# Patient Record
Sex: Female | Born: 1965
Health system: Southern US, Community
[De-identification: ages and names within clinical notes are randomized; demographics above are authoritative.]

## PROBLEM LIST (undated history)

## (undated) DIAGNOSIS — I10 Essential (primary) hypertension: Secondary | ICD-10-CM

## (undated) DIAGNOSIS — M1990 Unspecified osteoarthritis, unspecified site: Secondary | ICD-10-CM

## (undated) DIAGNOSIS — T7840XA Allergy, unspecified, initial encounter: Secondary | ICD-10-CM

## (undated) DIAGNOSIS — Z91018 Allergy to other foods: Secondary | ICD-10-CM

## (undated) DIAGNOSIS — E7800 Pure hypercholesterolemia, unspecified: Secondary | ICD-10-CM

## (undated) DIAGNOSIS — E782 Mixed hyperlipidemia: Secondary | ICD-10-CM

## (undated) HISTORY — PX: EYE SURGERY: SHX253

## (undated) HISTORY — DX: Essential (primary) hypertension: I10

## (undated) HISTORY — DX: Allergy, unspecified, initial encounter: T78.40XA

## (undated) HISTORY — DX: Allergy to other foods: Z91.018

## (undated) HISTORY — PX: FRACTURE SURGERY: SHX138

## (undated) HISTORY — DX: Unspecified osteoarthritis, unspecified site: M19.90

## (undated) HISTORY — DX: Mixed hyperlipidemia: E78.2

## (undated) HISTORY — PX: TUBAL LIGATION: SHX77

---

## 1997-11-11 ENCOUNTER — Other Ambulatory Visit: Admission: RE | Admit: 1997-11-11 | Discharge: 1997-11-11 | Payer: Self-pay | Admitting: Obstetrics and Gynecology

## 1997-11-12 ENCOUNTER — Other Ambulatory Visit: Admission: RE | Admit: 1997-11-12 | Discharge: 1997-11-12 | Payer: Self-pay | Admitting: Obstetrics and Gynecology

## 1997-12-10 ENCOUNTER — Other Ambulatory Visit: Admission: RE | Admit: 1997-12-10 | Discharge: 1997-12-10 | Payer: Self-pay | Admitting: Obstetrics & Gynecology

## 1998-02-02 ENCOUNTER — Other Ambulatory Visit: Admission: RE | Admit: 1998-02-02 | Discharge: 1998-02-02 | Payer: Self-pay | Admitting: Obstetrics and Gynecology

## 1998-05-02 ENCOUNTER — Inpatient Hospital Stay (HOSPITAL_COMMUNITY): Admission: AD | Admit: 1998-05-02 | Discharge: 1998-05-04 | Payer: Self-pay | Admitting: Obstetrics and Gynecology

## 2001-12-19 ENCOUNTER — Encounter: Payer: Self-pay | Admitting: Physical Medicine & Rehabilitation

## 2001-12-19 ENCOUNTER — Ambulatory Visit (HOSPITAL_COMMUNITY)
Admission: RE | Admit: 2001-12-19 | Discharge: 2001-12-19 | Payer: Self-pay | Admitting: Physical Medicine & Rehabilitation

## 2003-05-05 ENCOUNTER — Other Ambulatory Visit: Admission: RE | Admit: 2003-05-05 | Discharge: 2003-05-05 | Payer: Self-pay | Admitting: Obstetrics and Gynecology

## 2009-04-22 ENCOUNTER — Ambulatory Visit (HOSPITAL_COMMUNITY): Admission: RE | Admit: 2009-04-22 | Discharge: 2009-04-22 | Payer: Self-pay | Admitting: Obstetrics

## 2010-11-16 ENCOUNTER — Emergency Department (HOSPITAL_COMMUNITY)
Admission: EM | Admit: 2010-11-16 | Discharge: 2010-11-16 | Disposition: A | Discharge status: Home / Self Care | Payer: 59 | Attending: Emergency Medicine | Admitting: Emergency Medicine

## 2010-11-16 DIAGNOSIS — Z23 Encounter for immunization: Secondary | ICD-10-CM | POA: Insufficient documentation

## 2010-11-16 DIAGNOSIS — Z203 Contact with and (suspected) exposure to rabies: Secondary | ICD-10-CM | POA: Insufficient documentation

## 2010-11-19 ENCOUNTER — Inpatient Hospital Stay (INDEPENDENT_AMBULATORY_CARE_PROVIDER_SITE_OTHER)
Admission: RE | Admit: 2010-11-19 | Discharge: 2010-11-19 | Disposition: A | Discharge status: Home / Self Care | Payer: 59 | Source: Ambulatory Visit

## 2010-11-19 DIAGNOSIS — Z23 Encounter for immunization: Secondary | ICD-10-CM

## 2010-11-23 ENCOUNTER — Inpatient Hospital Stay (INDEPENDENT_AMBULATORY_CARE_PROVIDER_SITE_OTHER)
Admission: RE | Admit: 2010-11-23 | Discharge: 2010-11-23 | Disposition: A | Discharge status: Home / Self Care | Payer: 59 | Source: Ambulatory Visit | Attending: Family Medicine | Admitting: Family Medicine

## 2010-11-23 DIAGNOSIS — Z23 Encounter for immunization: Secondary | ICD-10-CM

## 2010-12-01 ENCOUNTER — Inpatient Hospital Stay (INDEPENDENT_AMBULATORY_CARE_PROVIDER_SITE_OTHER)
Admission: RE | Admit: 2010-12-01 | Discharge: 2010-12-01 | Disposition: A | Discharge status: Home / Self Care | Payer: 59 | Source: Ambulatory Visit | Attending: Emergency Medicine | Admitting: Emergency Medicine

## 2010-12-01 DIAGNOSIS — Z23 Encounter for immunization: Secondary | ICD-10-CM

## 2012-05-22 ENCOUNTER — Telehealth: Payer: Self-pay

## 2012-05-22 NOTE — Telephone Encounter (Signed)
Pt is requesting copy of TB form to be sent to FAX  attn: brandy [phone]  [phone]  NEVER MIND _ last one is out of date.   Patient will come in to have another TB test done.

## 2013-07-26 ENCOUNTER — Emergency Department (HOSPITAL_COMMUNITY)
Admission: EM | Admit: 2013-07-26 | Discharge: 2013-07-26 | Disposition: A | Discharge status: Home / Self Care | Payer: 59 | Source: Home / Self Care | Attending: Family Medicine | Admitting: Family Medicine

## 2013-07-26 DIAGNOSIS — K0889 Other specified disorders of teeth and supporting structures: Secondary | ICD-10-CM

## 2013-07-26 DIAGNOSIS — K089 Disorder of teeth and supporting structures, unspecified: Secondary | ICD-10-CM

## 2013-07-26 MED ORDER — TRAMADOL HCL 50 MG PO TABS: 50.0000 mg | ORAL_TABLET | Freq: Four times a day (QID) | ORAL | Status: DC | PRN

## 2013-07-26 MED ORDER — AMOXICILLIN 875 MG PO TABS: 875.0000 mg | ORAL_TABLET | Freq: Two times a day (BID) | ORAL | Status: DC

## 2013-07-26 NOTE — ED Provider Notes (Signed)
CSN: [id number]     Arrival date & time 07/26/13  1355 History   First MD Initiated Contact with Patient 07/26/13 1524     Chief Complaint  Patient presents with  . Dental Pain   (Consider location/radiation/quality/duration/timing/severity/associated sxs/prior Treatment) HPI Comments: 48f with dental pain.  This began last night. She has worsening pain in her anterior left upper jaw that is throbbing in nature. She tried to look in there but does not see anything abnormal. She denies any trauma. She has a history of a similar condition and a different tooth couple of years ago. No other symptoms  Patient is a 48 y.o. female presenting with tooth pain.  Dental Pain Associated symptoms: no fever     No past medical history on file. No past surgical history on file. No family history on file. History  Substance Use Topics  . Smoking status: Not on file  . Smokeless tobacco: Not on file  . Alcohol Use: Not on file   OB History   No data available     Review of Systems  Constitutional: Negative for fever and chills.  HENT: Positive for dental problem.   Eyes: Negative for visual disturbance.  Respiratory: Negative for cough and shortness of breath.   Cardiovascular: Negative for chest pain, palpitations and leg swelling.  Gastrointestinal: Negative for nausea, vomiting and abdominal pain.  Endocrine: Negative for polydipsia and polyuria.  Genitourinary: Negative for dysuria, urgency and frequency.  Musculoskeletal: Negative for arthralgias and myalgias.  Skin: Negative for rash.  Neurological: Negative for dizziness, weakness and light-headedness.    Allergies  Codeine sulfate  Home Medications   Current Outpatient Rx  Name  Route  Sig  Dispense  Refill  . naproxen sodium (ALEVE) 220 MG tablet   Oral   Take 220 mg by mouth as needed.         Marland Kitchen amoxicillin (AMOXIL) 875 MG tablet   Oral   Take 1 tablet (875 mg total) by mouth 2 (two) times daily.   28 tablet   0   . traMADol (ULTRAM) 50 MG tablet   Oral   Take 1 tablet (50 mg total) by mouth every 6 (six) hours as needed.   15 tablet   0    BP 144/96  Pulse 92  Temp(Src) 98.4 F (36.9 C) (Oral)  Resp 16  SpO2 97%  LMP 06/18/2013 Physical Exam  Nursing note and vitals reviewed. Constitutional: She is oriented to person, place, and time. Vital signs are normal. She appears well-developed and well-nourished. No distress.  HENT:  Head: Normocephalic and atraumatic.  Mouth/Throat: Uvula is midline, oropharynx is clear and moist and mucous membranes are normal. She does not have dentures. No oral lesions. No trismus in the jaw. Normal dentition. No dental abscesses, uvula swelling, lacerations or dental caries.  Cardiovascular: Normal rate, regular rhythm and normal heart sounds.  Exam reveals no gallop and no friction rub.   No murmur heard. Pulmonary/Chest: Effort normal. No respiratory distress.  Neurological: She is alert and oriented to person, place, and time. She has normal strength. Coordination normal.  Skin: Skin is warm and dry. No rash noted. She is not diaphoretic.  Psychiatric: She has a normal mood and affect. Judgment normal.    ED Course  Procedures (including critical care time) Labs Review Labs Reviewed - No data to display Imaging Review No results found.  EKG normal  MDM   1. Toothache    No obvious source of infection.  We'll give amoxicillin and tramadol for possible early dental and she will followup with her dentist ASAP   Meds ordered this encounter  Medications  . naproxen sodium (ALEVE) 220 MG tablet    Sig: Take 220 mg by mouth as needed.  Marland Kitchen amoxicillin (AMOXIL) 875 MG tablet    Sig: Take 1 tablet (875 mg total) by mouth 2 (two) times daily.    Dispense:  28 tablet    Refill:  0    Order Specific Question:  Supervising Provider    Answer:  Billy Fischer [phone]  . traMADol (ULTRAM) 50 MG tablet    Sig: Take 1 tablet (50 mg total) by mouth every 6  (six) hours as needed.    Dispense:  15 tablet    Refill:  0    Order Specific Question:  Supervising Provider    Answer:  Ihor Gully D [place], PA-C 07/26/13 [phone]

## 2013-07-26 NOTE — Discharge Instructions (Signed)

## 2013-07-26 NOTE — ED Notes (Signed)
Complains of tooth pain on left side, states has been taking OTC Aleve 200 mg, last dose 11:39 today

## 2013-07-28 ENCOUNTER — Other Ambulatory Visit (HOSPITAL_COMMUNITY): Payer: Self-pay | Admitting: Obstetrics

## 2013-07-28 DIAGNOSIS — Z1231 Encounter for screening mammogram for malignant neoplasm of breast: Secondary | ICD-10-CM

## 2013-07-28 NOTE — ED Provider Notes (Signed)
Medical screening examination/treatment/procedure(s) were performed by a resident physician or non-physician practitioner and as the supervising physician I was immediately available for consultation/collaboration.  Sherl Yzaguirre, MD    Mayeli Bornhorst S Xochil Shanker, MD 07/28/13 0749 

## 2013-07-30 ENCOUNTER — Ambulatory Visit (HOSPITAL_COMMUNITY): Payer: 59

## 2013-07-31 ENCOUNTER — Ambulatory Visit (HOSPITAL_COMMUNITY)
Admission: RE | Admit: 2013-07-31 | Discharge: 2013-07-31 | Disposition: A | Discharge status: Home / Self Care | Payer: 59 | Source: Ambulatory Visit | Attending: Obstetrics | Admitting: Obstetrics

## 2013-07-31 ENCOUNTER — Ambulatory Visit (HOSPITAL_COMMUNITY): Payer: 59

## 2013-07-31 DIAGNOSIS — Z1231 Encounter for screening mammogram for malignant neoplasm of breast: Secondary | ICD-10-CM | POA: Insufficient documentation

## 2013-10-27 ENCOUNTER — Other Ambulatory Visit: Payer: Self-pay | Admitting: Obstetrics and Gynecology

## 2013-10-27 DIAGNOSIS — R109 Unspecified abdominal pain: Secondary | ICD-10-CM

## 2013-11-05 ENCOUNTER — Ambulatory Visit (HOSPITAL_COMMUNITY): Payer: 59

## 2014-04-22 ENCOUNTER — Encounter (HOSPITAL_COMMUNITY): Payer: Self-pay | Admitting: Emergency Medicine

## 2014-04-22 ENCOUNTER — Emergency Department (HOSPITAL_COMMUNITY)
Admission: EM | Admit: 2014-04-22 | Discharge: 2014-04-22 | Disposition: A | Discharge status: Home / Self Care | Payer: 59 | Source: Home / Self Care | Attending: Family Medicine | Admitting: Family Medicine

## 2014-04-22 ENCOUNTER — Other Ambulatory Visit: Payer: Self-pay

## 2014-04-22 DIAGNOSIS — G5622 Lesion of ulnar nerve, left upper limb: Secondary | ICD-10-CM

## 2014-04-22 NOTE — Discharge Instructions (Signed)
Thank you for coming in today. Followup with Dr. Ernie Hew or another primary care provider. Followup with Dr. Alfonso Ramus if the arm continues to bother you.  Use a towel technique like we talked about.  Call or go to the emergency room if you get worse, have trouble breathing, have chest pains, or palpitations.    Cubital Tunnel Syndrome (Ulnar Neuritis) Cubital tunnel syndrome is a disorder of the nervous system of the elbow and upper arm the causes pain, tingling, weakness of the hand, or the loss of feeling in the ring and little fingers. The disorder is caused by a compression or stretching of the ulnar nerve at the elbow and in the forearm by muscles or ligament-like tissues. The ulnar nerve is susceptible to injury because it has little tissue that protects it at the elbow. The lack of protection increases the risk of injury. Cubital tunnel syndrome may decrease athletic performance in sports that require strong hand or wrist actions (tennis or racquetball).  SYMPTOMS   Clumsiness or weakness of the hand.  Poor dexterity (fine hand function).  Tenderness of the inner elbow.  Aching or soreness of the inner elbow.  Increased pain with forced full-elbow bending.  Reduced control with throwing, such as pitching.  Tingling, numbness, or burning inside the forearm or in part of the hand or fingers (especially the little finger or ring finger).  Sharp pains that shoot from the elbow down to the wrist and hand.  A weak grip, especially power grip, and a weak pinch.  Reduced performance in sports that require a strong grip. CAUSES   Increased pressure on the ulnar nerve at the elbow, arm, or forearm caused by swollen, inflamed, or scarred tissues; ligament-like; or between muscles.  Stretching of the nerve due to loose elbow ligaments.  Trauma to the nerve at the elbow.  Repetitive elbow bending. RISK INCREASES WITH:  Poor strength or flexibility.  Inadequate warm-up properly  physical activity.  Diabetes mellitus.  under-active thyroid gland(hypothyroidism).  Repetitive and/or strenuous throwing motions such as baseball and javelin throwing.  Contact sports (football, soccer, rugby, or lacrosse).  Other elbow conditions (medial epicondylitis or loose inner elbow ligaments). PREVENTION  Warm up and stretch properly before activity.  Maintain physical fitness:  Wrist, forearm, and elbow flexibility.  Muscle strength and endurance.  Cardiovascular fitness.  Wear proper protective equipment, including elbow pads.  Learn and use proper throwing techniques. PROGNOSIS  Cubital tunnel syndrome is typically curable is treated appropriately. Is some cases the condition may heal without treatment. If the muscle begins to waste or the nerve damage worsens, surgery may be necessary. RELATED COMPLICATIONS   Permanent numbness and weakness of the ring and little fingers.  Weak grip.  Permanent paralysis of some hand and finger muscles.  Risks associated with surgery, including infection, bleeding, injury to nerves (including the ulnar nerve), recurrent or continued symptoms, and elbow stiffness. TREATMENT  Treatment initially involves stopping the activities that cause the symptoms to worsen. Medications and ice can be used to reduce pain in inflammation. Splinting and protecting the elbow with padding (especially at night) to prevent full bending of the elbow may help. Stretching and strengthening exercises of the muscles of the forearm and elbow are important, and they may be performed at home or with the assistance of a therapist. If conservative treatment is not successful, surgery may be necessary to reduce compression of the nerve. Before return to sport, assessment for proper throwing and hitting mechanics is important.  MEDICATION   If pain medication is necessary, nonsteroidal anti-inflammatory medications, such as aspirin and ibuprofen, or other minor  pain relievers, such as acetaminophen, are often recommended. Contact your caregiver immediately if any bleeding, stomach upset, or signs of an allergic reaction occur.  Prescription pain relievers are usually only prescribed after surgery. Use only as directed and only as much as you need. COLD THERAPY   Cold treatment (icing) relieves pain and reduces inflammation. Cold treatment should be applied for 10 to 15 minutes every 2 to 3 hours for inflammation and pain and immediately after any activity that aggravates your symptoms. Use ice packs or an ice massage. SEEK MEDICAL CARE IF:   Symptoms get worse or do not improve in 2 weeks despite treatment.  You experience pain, numbness, or coldness in the hand.  Blue, gray, or dark color appears in the fingernails.  Any of the following occur after surgery: increased pain, swelling, redness, drainage, or bleeding in the surgical area or signs of infection.  New, unexplained symptoms develop (drugs used in treatment may produce side effects). Document Released: 07/03/2005 Document Revised: 09/25/2011 Document Reviewed: 10/15/2008 [facility] Patient Information 2015 [place], [place]. This information is not intended to replace advice given to you by your health care provider. Make sure you discuss any questions you have with your health care provider.  PRIMARY CARE Paramedic at [place], [place] Ph [phone]  Fax [phone]  Therapist, music at [facility] [address] [place], [place] Ph [phone]  Fax [phone]  Therapist, music at [place] / [place] [phone] [address] [place], [place] Ph [phone]  Fax [phone]  [facility] at [facility] [address] [place]  [place], [place] Ph [phone]  Fax [phone]  [place] [address] [place] [address] [place], [place] Ph [phone]  Fax [phone]  [facility] at [facility] [place], [place] Ph [phone]  Fax [phone]   [place] @ [place] [address] Phone: [phone]   [place] @ [facility] [place]. [place] [address] Phone: [place] @ [place] [address] [place] [address] Phone: [place] @ [place]. [place] [address] Phone: [place] @ [place]. [employer], [place] [address] Phone: [phone]   [place] @ [place] [address] [place] [address] Phone: [phone]   Dr. Rachell Cipro [address] [place] [address] [phone]

## 2014-04-22 NOTE — ED Notes (Signed)
C/o intermittent left arm numbness onset 1 yr ++  Also c/o intermittent  "warm sensation" that begins at lower abd and travels to shoulders  Alert, no signs of acute distress.

## 2014-04-22 NOTE — ED Provider Notes (Signed)
Beverly Hughes is a 48 y.o. female who presents to Urgent Care today for left arm numbness. Patient has a one-year history of intermittent left arm numbness. She especially notes the numbness in the ulnar aspect of her hand. This morning she awoke with numbness and tingling. She denies any chest pains palpitations or shortness of breath. She is worried about her heart and would like an EKG if possible. She denies any injury. She does not have her primary care Dr.   History reviewed. No pertinent past medical history. History  Substance Use Topics  . Smoking status: Never Smoker   . Smokeless tobacco: Not on file  . Alcohol Use: No   ROS as above Medications: No current facility-administered medications for this encounter.   Current Outpatient Prescriptions  Medication Sig Dispense Refill  . amoxicillin (AMOXIL) 875 MG tablet Take 1 tablet (875 mg total) by mouth 2 (two) times daily.  28 tablet  0  . naproxen sodium (ALEVE) 220 MG tablet Take 220 mg by mouth as needed.      . traMADol (ULTRAM) 50 MG tablet Take 1 tablet (50 mg total) by mouth every 6 (six) hours as needed.  15 tablet  0    Exam:  BP 124/78  Pulse 82  Temp(Src) 99 F (37.2 C) (Oral)  Resp 16  SpO2 97%  LMP 02/28/2014 Gen: Well NAD HEENT: EOMI,  MMM Lungs: Normal work of breathing. CTABL Heart: RRR no MRG Abd: NABS, Soft. Nondistended, Nontender Exts: Brisk capillary refill, warm and well perfused.  Left arm: Normal shoulder elbow and wrist range of motion. Positive Tinel's test at the cubital tunnel. Symptoms reproducible with elbow flexion. Pulses capillary refill sensation and strength are intact distally.  Twelve-lead EKG shows normal sinus rhythm at 72 beats per minute with no ST segment elevation or depression.  No results found for this or any previous visit (from the past 24 hour(s)). No results found.  Assessment and Plan: 48 y.o. female with left arm cubital tunnel syndrome.  Plan for towel  splint at night. Followup with PCP. Follow up with sports medicine if not improving from a cubital tunnel syndrome standpoint.  Discussed warning signs or symptoms. Please see discharge instructions. Patient expresses understanding.     Gregor Hams, MD 04/22/14 [phone]

## 2014-07-03 ENCOUNTER — Other Ambulatory Visit (HOSPITAL_COMMUNITY): Payer: Self-pay | Admitting: Obstetrics and Gynecology

## 2014-07-03 DIAGNOSIS — Z1231 Encounter for screening mammogram for malignant neoplasm of breast: Secondary | ICD-10-CM

## 2014-08-04 ENCOUNTER — Ambulatory Visit (HOSPITAL_COMMUNITY): Payer: 59 | Attending: Obstetrics and Gynecology

## 2016-10-29 ENCOUNTER — Encounter (HOSPITAL_COMMUNITY): Payer: Self-pay | Admitting: Emergency Medicine

## 2016-10-29 ENCOUNTER — Ambulatory Visit (HOSPITAL_COMMUNITY)
Admission: EM | Admit: 2016-10-29 | Discharge: 2016-10-29 | Disposition: A | Discharge status: Home / Self Care | Payer: 59 | Attending: Family Medicine | Admitting: Family Medicine

## 2016-10-29 DIAGNOSIS — N390 Urinary tract infection, site not specified: Secondary | ICD-10-CM | POA: Diagnosis not present

## 2016-10-29 DIAGNOSIS — R350 Frequency of micturition: Secondary | ICD-10-CM | POA: Diagnosis not present

## 2016-10-29 DIAGNOSIS — R319 Hematuria, unspecified: Secondary | ICD-10-CM

## 2016-10-29 DIAGNOSIS — Z3202 Encounter for pregnancy test, result negative: Secondary | ICD-10-CM

## 2016-10-29 HISTORY — DX: Pure hypercholesterolemia, unspecified: E78.00

## 2016-10-29 LAB — POCT PREGNANCY, URINE: Preg Test, Ur: NEGATIVE

## 2016-10-29 LAB — POCT URINALYSIS DIP (DEVICE)
Bilirubin Urine: NEGATIVE
Glucose, UA: NEGATIVE mg/dL
Ketones, ur: NEGATIVE mg/dL
Nitrite: POSITIVE — AB
Protein, ur: NEGATIVE mg/dL
Specific Gravity, Urine: 1.025 (ref 1.005–1.030)
Urobilinogen, UA: 0.2 mg/dL (ref 0.0–1.0)
pH: 5.5 (ref 5.0–8.0)

## 2016-10-29 MED ORDER — SULFAMETHOXAZOLE-TRIMETHOPRIM 800-160 MG PO TABS: 1.0000 | ORAL_TABLET | Freq: Two times a day (BID) | ORAL | 0 refills | Status: AC

## 2016-10-29 NOTE — ED Triage Notes (Signed)
Onset Friday of lower pelvic pain "like a uti".  Noticed some blood Friday.  Blood was darker on Saturday.  Feels pressure and pain with urination

## 2016-10-29 NOTE — ED Provider Notes (Signed)
[place]    CSN: [id number] Arrival date & time: 10/29/16  1238     History   Chief Complaint Chief Complaint  Patient presents with  . Abdominal Pain    HPI Beverly Hughes is a 50 y.o. female.   Onset Friday of lower pelvic pain "like a uti".  Noticed some blood in the toilet bowl Friday.  Blood was darker on Saturday.  Feels pressure and pain with urination  Last menstrual period for 3 was 3-1/2 weeks ago.  Patient works as a Marine scientist at [employer].      Past Medical History:  Diagnosis Date  . High cholesterol     There are no active problems to display for this patient.   History reviewed. No pertinent surgical history.  OB History    No data available       Home Medications    Prior to Admission medications   Medication Sig Start Date End Date Taking? Authorizing Provider  ibuprofen (ADVIL,MOTRIN) 200 MG tablet Take 200 mg by mouth every 6 (six) hours as needed.   Yes Historical Provider, MD  sulfamethoxazole-trimethoprim (BACTRIM DS,SEPTRA DS) 800-160 MG tablet Take 1 tablet by mouth 2 (two) times daily. 10/29/16 11/05/16  Robyn Haber, MD    Family History No family history on file.  Social History Social History  Substance Use Topics  . Smoking status: Never Smoker  . Smokeless tobacco: Not on file  . Alcohol use No     Allergies   Codeine sulfate   Review of Systems Review of Systems  Constitutional: Negative.   HENT: Negative.   Gastrointestinal: Negative.   Genitourinary: Positive for frequency, hematuria and pelvic pain.  All other systems reviewed and are negative.    Physical Exam Triage Vital Signs ED Triage Vitals  Enc Vitals Group     BP      Pulse      Resp      Temp      Temp src      SpO2      Weight      Height      Head Circumference      Peak Flow      Pain Score      Pain Loc      Pain Edu?      Excl. in [place]?    No data found.   Updated Vital Signs BP (!) 144/82 (BP  Location: Left Arm)   Pulse 92   Temp 98.6 F (37 C) (Oral)   Resp 16   LMP 10/03/2016   SpO2 98%    Physical Exam  Constitutional: She is oriented to person, place, and time. She appears well-developed and well-nourished.  HENT:  Right Ear: External ear normal.  Left Ear: External ear normal.  Mouth/Throat: Oropharynx is clear and moist.  Eyes: Conjunctivae and EOM are normal. Pupils are equal, round, and reactive to light.  Neck: Normal range of motion. Neck supple.  Pulmonary/Chest: Effort normal.  Abdominal: Soft. There is no tenderness.  Musculoskeletal: Normal range of motion.  Neurological: She is alert and oriented to person, place, and time.  Skin: Skin is warm and dry.  Nursing note and vitals reviewed.    UC Treatments / Results  Labs (all labs ordered are listed, but only abnormal results are displayed) Labs Reviewed  POCT URINALYSIS DIP (DEVICE) - Abnormal; Notable for the following:       Result Value   Hgb urine  dipstick SMALL (*)    Nitrite POSITIVE (*)    Leukocytes, UA TRACE (*)    All other components within normal limits  POCT PREGNANCY, URINE    EKG  EKG Interpretation None       Radiology No results found.  Procedures Procedures (including critical care time)  Medications Ordered in UC Medications - No data to display   Initial Impression / Assessment and Plan / UC Course  I have reviewed the triage vital signs and the nursing notes.  Pertinent labs & imaging results that were available during my care of the patient were reviewed by me and considered in my medical decision making (see chart for details).     Final Clinical Impressions(s) / UC Diagnoses   Final diagnoses:  Lower urinary tract infectious disease    New Prescriptions New Prescriptions   SULFAMETHOXAZOLE-TRIMETHOPRIM (BACTRIM DS,SEPTRA DS) 800-160 MG TABLET    Take 1 tablet by mouth 2 (two) times daily.     Robyn Haber, MD 10/29/16 1328

## 2016-10-29 NOTE — Discharge Instructions (Signed)
You are having a urinary tract infection.  Take all of the antibiotic along with plenty of fluids.

## 2017-12-13 DIAGNOSIS — Z01419 Encounter for gynecological examination (general) (routine) without abnormal findings: Secondary | ICD-10-CM | POA: Diagnosis not present

## 2017-12-13 DIAGNOSIS — Z6839 Body mass index (BMI) 39.0-39.9, adult: Secondary | ICD-10-CM | POA: Diagnosis not present

## 2017-12-13 DIAGNOSIS — R109 Unspecified abdominal pain: Secondary | ICD-10-CM | POA: Diagnosis not present

## 2018-07-28 ENCOUNTER — Encounter (HOSPITAL_COMMUNITY): Payer: Self-pay | Admitting: Emergency Medicine

## 2018-07-28 ENCOUNTER — Emergency Department (HOSPITAL_COMMUNITY)
Admission: EM | Admit: 2018-07-28 | Discharge: 2018-07-28 | Disposition: A | Discharge status: Home / Self Care | Payer: 59 | Attending: Emergency Medicine | Admitting: Emergency Medicine

## 2018-07-28 ENCOUNTER — Other Ambulatory Visit: Payer: Self-pay

## 2018-07-28 DIAGNOSIS — M542 Cervicalgia: Secondary | ICD-10-CM | POA: Diagnosis not present

## 2018-07-28 DIAGNOSIS — N644 Mastodynia: Secondary | ICD-10-CM

## 2018-07-28 DIAGNOSIS — N6320 Unspecified lump in the left breast, unspecified quadrant: Secondary | ICD-10-CM | POA: Insufficient documentation

## 2018-07-28 NOTE — ED Triage Notes (Signed)
Pt complains of lump on left breast for 5 days Pt complains of intermittent  left leg swelling and pain for 2 years. pt also complains of neck pain for 1 week. Pt states she has degenerative disc disease. Pt wants MRI, Pt wants head to toe exam.

## 2018-07-28 NOTE — ED Notes (Signed)
Patient verbalizes understanding of discharge instructions. Opportunity for questioning and answers were provided. Armband removed by staff, pt discharged from ED.  

## 2018-07-28 NOTE — ED Notes (Signed)
Pt also complains of burning sensation left rib pain for 2 years

## 2018-07-28 NOTE — ED Provider Notes (Signed)
[facility] EMERGENCY DEPARTMENT Provider Note   CSN: [id number] Arrival date & time: 07/28/18  1604     History   Chief Complaint Chief Complaint  Patient presents with  . chest pain/lump on left side of chest  . Neck Pain  . left leg swelling    HPI Beverly Hughes is a 53 y.o. female.  53 year old female with past medical history including hyperlipidemia who presents with multiple complaints including left breast pain and chronic neck pain.  Patient states that for the past 5 days, she has noticed an area of her left upper breast that is sore and she thinks that she feels a lump.  She is about to start her menstrual cycle any day now and at first she thought it was related to this but she then became worried that it.  No redness or nipple discharge.  No fevers recent illness.  She also notes that she has a history of degenerative disc disease and has been having worsening problems with her neck pain.  No extremity numbness or weakness.  She requests a "head to toe exam."  She does not follow with PCP. Denies any neck trauma or injury.   The history is provided by the patient.  Neck Pain    Past Medical History:  Diagnosis Date  . High cholesterol     There are no active problems to display for this patient.   History reviewed. No pertinent surgical history.   OB History   No obstetric history on file.      Home Medications    Prior to Admission medications   Medication Sig Start Date End Date Taking? Authorizing Provider  ibuprofen (ADVIL,MOTRIN) 200 MG tablet Take 200 mg by mouth every 6 (six) hours as needed.    [provider]    Family History No family history on file.  Social History Social History   Tobacco Use  . Smoking status: Never Smoker  Substance Use Topics  . Alcohol use: No  . Drug use: No     Allergies   Codeine sulfate   Review of Systems Review of Systems  Musculoskeletal: Positive for neck pain.     All other systems reviewed and are negative except that which was mentioned in HPI   Physical Exam Updated Vital Signs BP (!) 165/97 (BP Location: Right Arm)   Pulse 97   Temp 98.4 F (36.9 C) (Oral)   Resp 16   SpO2 100%   Physical Exam Vitals signs and nursing note reviewed. Exam conducted with a chaperone present.  Constitutional:      General: She is not in acute distress.    Appearance: She is well-developed.  HENT:     Head: Normocephalic and atraumatic.  Eyes:     Conjunctiva/sclera: Conjunctivae normal.     Pupils: Pupils are equal, round, and reactive to light.  Neck:     Musculoskeletal: Neck supple.  Cardiovascular:     Rate and Rhythm: Normal rate and regular rhythm.     Heart sounds: Normal heart sounds. No murmur.  Pulmonary:     Effort: Pulmonary effort is normal.     Breath sounds: Normal breath sounds.  Abdominal:     General: Bowel sounds are normal. There is no distension.     Palpations: Abdomen is soft.     Tenderness: There is no abdominal tenderness.  Skin:    General: Skin is warm and dry.     Comments: No erythema,  induration, warmth, or masses felt on left upper central breast  Neurological:     Mental Status: She is alert and oriented to person, place, and time.     Sensory: No sensory deficit.     Motor: No weakness.     Comments: Fluent speech 5/5 strength and normal sensation throughout  Psychiatric:        Judgment: Judgment normal.      ED Treatments / Results  Labs (all labs ordered are listed, but only abnormal results are displayed) Labs Reviewed - No data to display  EKG None  Radiology No results found.  Procedures Procedures (including critical care time)  Medications Ordered in ED Medications - No data to display   Initial Impression / Assessment and Plan / ED Course  I have reviewed the triage vital signs and the nursing notes.       No signs of breast abscess or infectious process on exam and I  could not actually palpate any mass.  Advised patient to follow-up with her OB/GYN to have outpatient breast ultrasound.  Regarding her neck pain, she has no red flag features and is neurologically intact.  I have advised that she follow-up with a PCP for further assessment and consideration of physical therapy.  Discussed supportive measures regarding her pain.  Return precautions reviewed.  Final Clinical Impressions(s) / ED Diagnoses   Final diagnoses:  Neck pain  Painful lumpy left breast    ED Discharge Orders    None       Little, Wenda Overland, MD 07/28/18 2259

## 2018-08-09 DIAGNOSIS — Z23 Encounter for immunization: Secondary | ICD-10-CM | POA: Diagnosis not present

## 2018-11-17 ENCOUNTER — Ambulatory Visit (HOSPITAL_COMMUNITY)
Admission: EM | Admit: 2018-11-17 | Discharge: 2018-11-17 | Disposition: A | Discharge status: Home / Self Care | Payer: 59 | Attending: Family Medicine | Admitting: Family Medicine

## 2018-11-17 ENCOUNTER — Other Ambulatory Visit: Payer: Self-pay

## 2018-11-17 ENCOUNTER — Encounter (HOSPITAL_COMMUNITY): Payer: Self-pay | Admitting: *Deleted

## 2018-11-17 DIAGNOSIS — R1310 Dysphagia, unspecified: Secondary | ICD-10-CM | POA: Diagnosis not present

## 2018-11-17 DIAGNOSIS — M542 Cervicalgia: Secondary | ICD-10-CM

## 2018-11-17 DIAGNOSIS — R0789 Other chest pain: Secondary | ICD-10-CM

## 2018-11-17 MED ORDER — MELOXICAM 7.5 MG PO TABS: 7.5000 mg | ORAL_TABLET | Freq: Every day | ORAL | 0 refills | Status: DC

## 2018-11-17 MED ORDER — CYCLOBENZAPRINE HCL 5 MG PO TABS: 5.0000 mg | ORAL_TABLET | Freq: Two times a day (BID) | ORAL | 0 refills | Status: DC | PRN

## 2018-11-17 MED ORDER — PREDNISONE 50 MG PO TABS: 50.0000 mg | ORAL_TABLET | Freq: Every day | ORAL | 0 refills | Status: AC

## 2018-11-17 NOTE — ED Triage Notes (Signed)
C/O posterior neck pain x 3 months and intermittent sharp pains to left chest.  Feels neck pain R/T "whiplash" when she falls asleep while sitting up.  C/O intermittent parasthesias in BUE. C/O chest pains x "couple months".  States went to ED, but had no work-up.  Denies any pain at present.  When present, no change with deep breathing, movement or palpation.  Reports "a lump" to left chest.

## 2018-11-17 NOTE — ED Provider Notes (Signed)
[place]    CSN: [id number] Arrival date & time: 11/17/18  1003     History   Chief Complaint Chief Complaint  Patient presents with  . Neck Pain    HPI [facility] is a 53 y.o. female history of hyperlipidemia, presenting today for evaluation of neck pain, chest pain and other multiple complaints.  Patient notes that over the past 4 months she has had worsening neck pain.  She believes she has a bulging disc in her cervical region of her spine.  She denies any injury or increase in activity.  Notes that she frequently has to sleep propped up due to neck discomfort and certain movements trigger her pain.  Occasional intermittent numbness and tingling extending into arms bilaterally.  Pain migrates.  She has been taking ibuprofen intermittently for her discomfort.  Denies changes in vision.  Denies headaches.  She has also had discomfort in her left chest/breast.  She is concerned that she believes that she has felt a lump.  Denies any triggers for chest discomfort including denying exertional, positional, eating, breathing triggers.  Denies personal history of diabetes, does note she has a history of high blood pressure, but she is not on medicine.  Denies history of smoking.  Denies nausea or vomiting.  Has had some left-sided abdominal discomfort recently.  Eating and drinking like normal.  She does also report a discomfort and sensation of food getting stuck in her throat.  She almost feels the sensation of feeling suffocated to the anterior aspect of her neck.  She feels this sensation with swallowing saliva, liquids and solids.  This is been going on over the past 1 to 2 weeks.  Denies history of reflux.  Does note that she drinks a daily detox water with grapefruit and lime.  HPI  Past Medical History:  Diagnosis Date  . High cholesterol     There are no active problems to display for this patient.   History reviewed. No pertinent surgical history.  OB History    No obstetric history on file.      Home Medications    Prior to Admission medications   Medication Sig Start Date End Date Taking? Authorizing Provider  Multiple Vitamin (MULTIVITAMIN) capsule Take 1 capsule by mouth daily.   Yes [provider]  cyclobenzaprine (FLEXERIL) 5 MG tablet Take 1-2 tablets (5-10 mg total) by mouth 2 (two) times daily as needed for muscle spasms. 11/17/18   Manjot Hinks C, PA-C  ibuprofen (ADVIL,MOTRIN) 200 MG tablet Take 200 mg by mouth every 6 (six) hours as needed.    [provider]  meloxicam (MOBIC) 7.5 MG tablet Take 1 tablet (7.5 mg total) by mouth daily. 11/17/18   Kaison Mcparland C, PA-C  predniSONE (DELTASONE) 50 MG tablet Take 1 tablet (50 mg total) by mouth daily with breakfast for 5 days. 11/17/18 11/22/18  Hazeline Charnley, Elesa Hacker, PA-C    Family History Family History  Problem Relation Age of Onset  . Diabetes Mother   . Kidney Stones Mother   . Heart disease Mother   . Cancer Father     Social History Social History   Tobacco Use  . Smoking status: Never Smoker  . Smokeless tobacco: Never Used  Substance Use Topics  . Alcohol use: No  . Drug use: No     Allergies   Codeine sulfate   Review of Systems Review of Systems  Constitutional: Negative for fatigue and fever.  HENT: Negative for  congestion, sinus pressure and sore throat.   Eyes: Negative for photophobia, pain and visual disturbance.  Respiratory: Negative for cough and shortness of breath.   Cardiovascular: Positive for chest pain.  Gastrointestinal: Positive for abdominal pain. Negative for nausea and vomiting.  Genitourinary: Negative for decreased urine volume and hematuria.  Musculoskeletal: Positive for myalgias and neck pain. Negative for neck stiffness.  Neurological: Negative for dizziness, syncope, facial asymmetry, speech difficulty, weakness, light-headedness, numbness and headaches.     Physical Exam Triage Vital Signs ED Triage Vitals   Enc Vitals Group     BP 11/17/18 1017 (!) 132/93     Pulse Rate 11/17/18 1017 88     Resp 11/17/18 1017 16     Temp 11/17/18 1017 98.3 F (36.8 C)     Temp Source 11/17/18 1017 Oral     SpO2 11/17/18 1017 97 %     Weight --      Height --      Head Circumference --      Peak Flow --      Pain Score 11/17/18 1020 7     Pain Loc --      Pain Edu? --      Excl. in [place]? --    No data found.  Updated Vital Signs BP (!) 132/93   Pulse 88   Temp 98.3 F (36.8 C) (Oral)   Resp 16   LMP 09/23/2018   SpO2 97%   Visual Acuity Right Eye Distance:   Left Eye Distance:   Bilateral Distance:    Right Eye Near:   Left Eye Near:    Bilateral Near:     Physical Exam Vitals signs and nursing note reviewed.  Constitutional:      General: She is not in acute distress.    Appearance: She is well-developed.  HENT:     Head: Normocephalic and atraumatic.     Mouth/Throat:     Comments: Oral mucosa pink and moist, no tonsillar enlargement or exudate. Posterior pharynx patent and nonerythematous, no uvula deviation or swelling. Normal phonation. Eyes:     Conjunctiva/sclera: Conjunctivae normal.  Neck:     Musculoskeletal: Neck supple.     Comments: No thyroid tenderness or nodules palpated Cardiovascular:     Rate and Rhythm: Normal rate and regular rhythm.     Heart sounds: No murmur.  Pulmonary:     Effort: Pulmonary effort is normal. No respiratory distress.     Breath sounds: Normal breath sounds.     Comments: Breathing comfortably at rest, CTABL, no wheezing, rales or other adventitious sounds auscultated  Mild discomfort to palpation along lateral aspect of left breast, no palpable mass or deformity noted within breast tissue Nontender to palpation over upper anterior chest Abdominal:     Palpations: Abdomen is soft.     Tenderness: There is abdominal tenderness.     Comments: Tenderness to palpation in left upper and lower quadrants, no focal tenderness, negative  Rovsing, no rebound  Musculoskeletal:     Comments: Tenderness to palpation throughout bilateral trapezius and cervical musculature, more prominent on right, nontender to palpation to cervical spine midline and thoracic spine midline  Full active range of motion of upper extremities, shoulder strength 5/5 and equal bilaterally, grip strength 5/5 and equal bilaterally, radial pulse 2+  Skin:    General: Skin is warm and dry.  Neurological:     Mental Status: She is alert.      UC Treatments /  Results  Labs (all labs ordered are listed, but only abnormal results are displayed) Labs Reviewed - No data to display  EKG None  Radiology No results found.  Procedures Procedures (including critical care time)  Medications Ordered in UC Medications - No data to display  Initial Impression / Assessment and Plan / UC Course  I have reviewed the triage vital signs and the nursing notes.  Pertinent labs & imaging results that were available during my care of the patient were reviewed by me and considered in my medical decision making (see chart for details).    Patient with neck pain without specific mechanism of injury, no midline tenderness, possible degenerative changes/bulging disc.  Strength intact in extremities.  Will provide prednisone course x5 days followed by continued NSAID use.  Muscle relaxer as needed is tender throughout musculature.  There is a decision making with patient, opted to defer imaging is likely not going to change management.  Follow-up with orthopedics if symptoms persisting, may benefit from physical therapy.  Did provide at home neck exercises to perform.  Advised to follow up with annual manual mammogram for further evaluation of any abnormalities in breast.  EKG normal sinus rhythm, no acute signs of ischemia or infarction.  No arrhythmia.  Do not suspect underlying cardiac etiology.  Symptoms have been going on for months as well.  Nonexertional.   Dysphagia- recommended following up with primary care/gastroenterology as may benefit from barium swallow versus other esophageal evaluation for sensation.  Maintaining airway.  Discussed strict return precautions. Patient verbalized understanding and is agreeable with plan.   Final Clinical Impressions(s) / UC Diagnoses   Final diagnoses:  Neck pain  Atypical chest pain  Dysphagia, unspecified type     Discharge Instructions     Neck pain Begin prednisone daily with food for the next 5 days After finishing prednisone may try Mobic daily or return to using Tylenol [phone] mg every 4-6 hours with ibuprofen 600 mg every 8 hours with food You may use flexeril as needed to help with pain. This is a muscle relaxer and causes sedation- please use only at bedtime or when you will be home and not have to drive/work-begin with 1 tablet, may increase to 2 if needed May try alternating ice and heat to neck Gentle neck exercises-see attached information  Please establish care with primary care for further discussion/management of other problems If discomfort in throat worsening or persisting please follow-up with gastroenterology is may need further studies to evaluate your esophagus If neck pain persisting please follow-up with orthopedics as you may benefit from physical therapy or further imaging  If any of your symptoms are changing or worsening in the meantime please follow-up here    ED Prescriptions    Medication Sig Dispense Auth. Provider   predniSONE (DELTASONE) 50 MG tablet Take 1 tablet (50 mg total) by mouth daily with breakfast for 5 days. 5 tablet Sama Arauz C, PA-C   cyclobenzaprine (FLEXERIL) 5 MG tablet Take 1-2 tablets (5-10 mg total) by mouth 2 (two) times daily as needed for muscle spasms. 24 tablet Xavia Kniskern C, PA-C   meloxicam (MOBIC) 7.5 MG tablet Take 1 tablet (7.5 mg total) by mouth daily. 20 tablet Adalin Vanderploeg, [place] C, PA-C     Controlled Substance  Prescriptions [place] Controlled Substance Registry consulted? Not Applicable   Janith Lima, [place] 11/17/18 1157

## 2018-11-17 NOTE — Discharge Instructions (Signed)
Neck pain Begin prednisone daily with food for the next 5 days After finishing prednisone may try Mobic daily or return to using Tylenol [phone] mg every 4-6 hours with ibuprofen 600 mg every 8 hours with food You may use flexeril as needed to help with pain. This is a muscle relaxer and causes sedation- please use only at bedtime or when you will be home and not have to drive/work-begin with 1 tablet, may increase to 2 if needed May try alternating ice and heat to neck Gentle neck exercises-see attached information  Please establish care with primary care for further discussion/management of other problems If discomfort in throat worsening or persisting please follow-up with gastroenterology is may need further studies to evaluate your esophagus If neck pain persisting please follow-up with orthopedics as you may benefit from physical therapy or further imaging  If any of your symptoms are changing or worsening in the meantime please follow-up here

## 2018-12-16 LAB — CBC
HCT: 40.5
Hemoglobin: 13.3
MCH: 29.2
MCHC: 32.8
MCV: 88.8 (ref 76–111)
MPV: 10.8 fL (ref 7.5–11.5)
RBC count: 4.56
RDW: 12.6
WBC Count: 4.7
platelet count: 273

## 2018-12-16 LAB — COMPLETE METABOLIC PANEL WITH GFR
ALBUMIN/GLOBULIN RATIO: 1.5
ALT: 19 (ref 3–30)
AST: 20
Albumin, Serum: 4.7
Alkaline Phosphatase, S: 44
BUN: 14 (ref 4–21)
Calcium: 9.8
Carbon Dioxide, Total: 26
Chloride: 102
Creatine, Serum: 0.73
EGFR (African American): 111
Globulin, Total: 3.1
Glucose: 112
Potassium: 4.4
Protein: 7.8
Sodium: 137
Total Bilirubin: 0.3

## 2018-12-16 LAB — HM MAMMOGRAPHY

## 2018-12-16 LAB — LIPID PANEL
Cholesterol: 147 (ref 0–200)
HDL: 58 (ref 35–70)
LDL Cholesterol: 72
LDl/HDL Ratio: 2.5
Triglycerides: 87 (ref 40–160)

## 2018-12-16 LAB — HIV ANTIBODY (ROUTINE TESTING W REFLEX): HIV 1&2 Ab, 4th Generation: NONREACTIVE

## 2018-12-16 LAB — HM PAP SMEAR: HM Pap smear: NORMAL

## 2018-12-16 LAB — TSH: TSH: 0.52 (ref 0.41–5.90)

## 2018-12-16 LAB — VITAMIN D 25 HYDROXY (VIT D DEFICIENCY, FRACTURES): Vit D, 25-Hydroxy: 17

## 2018-12-16 LAB — HM HEPATITIS C SCREENING LAB: HM Hepatitis Screen: NEGATIVE

## 2018-12-27 ENCOUNTER — Encounter: Payer: Self-pay | Admitting: Family Medicine

## 2018-12-31 ENCOUNTER — Encounter: Payer: Self-pay | Admitting: Family Medicine

## 2018-12-31 ENCOUNTER — Ambulatory Visit (INDEPENDENT_AMBULATORY_CARE_PROVIDER_SITE_OTHER): Payer: 59 | Admitting: Family Medicine

## 2018-12-31 ENCOUNTER — Other Ambulatory Visit: Payer: Self-pay

## 2018-12-31 DIAGNOSIS — M47812 Spondylosis without myelopathy or radiculopathy, cervical region: Secondary | ICD-10-CM | POA: Diagnosis not present

## 2018-12-31 NOTE — Progress Notes (Signed)
.  Virtual Visit via Telephone Note  I connected with Beverly Hughes on 12/31/18 at  3:10 PM EDT by telephone and verified that I am speaking with the correct person using two identifiers.  Location: Patient: Located at home during today's encounter  Provider: Located at primary care office     I discussed the limitations, risks, security and privacy concerns of performing an evaluation and management service by telephone and the availability of in person appointments. I also discussed with the patient that there may be a patient responsible charge related to this service. The patient expressed understanding and agreed to proceed.   History of Present Illness:  Neck Pain History cervical spine disease following a work-related injury in 2003. MR cervical spine 2003 was grossly abnormal indicating spondylosis involving.  Excruciating pain in the neck. Pain is radiating into her arms is bilateral. Numbness and tingling in finger and sharp pain around the elbow. Unable to take cyclobenzaprine due to drowsiness and she works full-time. For pain management, she is taking two ibuprofen which only numbs the pain. Associated symptoms include; sensation bones in neck are cracking, difficulty swallowing (previously referred to GI), headaches, intermittent bilateral numbness and tingling of digits. Reports no treatment since 2003. Previously symptoms resolved with physical therapy and self management. At present, since January, pain of neck has gradually worsened.   MRI Cervical Spine 2003 1.  MODERATE SIZE RIGHT POSTEROLATERAL DISK HERNIATION AT C3-4 WHICH EFFACES THE SUBARACHNOID SPACE BUT DOES NOT GROSSLY COMPRESS OR DEFORM THE CORD. 3.  MODERATE SPONDYLOSIS AT C6-7.  THE PATIENT DOES HAVE SOME ABNORMAL T2 SIGNAL WITHIN THE CORD AT THIS LEVEL CONSISTENT WITH MYELOPATHIC CHANGE.  THERE DOES NOT APPEAR TO BE GROSSLY COMPRESSIVE PATHOLOGY AT THIS LEVEL AND IT IS POSSIBLE THAT THIS DERIVES FROM THE  PATHOLOGY AT C5-6.  Assessment and Plan: 1. Cervical spondylosis -Offered muscle relaxer, patient declined. Will continue OTC ibuprofen until follow-up with orthopedic. - Ambulatory referral to Orthopedic Surgery  Follow Up Instructions: Schedule CPE once schedule permits   I discussed the assessment and treatment plan with the patient. The patient was provided an opportunity to ask questions and all were answered. The patient agreed with the plan and demonstrated an understanding of the instructions.   The patient was advised to call back or seek an in-person evaluation if the symptoms worsen or if the condition fails to improve as anticipated.  I provided 25 minutes of non-face-to-face time during this encounter.   Molli Barrows, FNP-C

## 2018-12-31 NOTE — Progress Notes (Deleted)
Called patient to initiate their telephone visit with provider Molli Barrows, FNP-C. Verified date of birth. States that she is still having neck pain. Worsened since urgent care visits earlier this year. KWalker, CMA.

## 2019-01-14 ENCOUNTER — Other Ambulatory Visit: Payer: Self-pay

## 2019-01-14 ENCOUNTER — Ambulatory Visit: Payer: Self-pay

## 2019-01-14 ENCOUNTER — Ambulatory Visit: Payer: 59 | Admitting: Orthopaedic Surgery

## 2019-01-14 ENCOUNTER — Encounter: Payer: Self-pay | Admitting: Orthopaedic Surgery

## 2019-01-14 ENCOUNTER — Ambulatory Visit (INDEPENDENT_AMBULATORY_CARE_PROVIDER_SITE_OTHER): Payer: 59 | Admitting: Orthopaedic Surgery

## 2019-01-14 VITALS — Ht 64.0 in | Wt 194.0 lb

## 2019-01-14 DIAGNOSIS — G9589 Other specified diseases of spinal cord: Secondary | ICD-10-CM | POA: Diagnosis not present

## 2019-01-14 DIAGNOSIS — M542 Cervicalgia: Secondary | ICD-10-CM

## 2019-01-14 NOTE — Progress Notes (Signed)
Office Visit Note   Patient: Beverly Hughes           Date of Birth: March 15, 1966           MRN: [id number] Visit Date: 01/14/2019              Requested by: Scot Jun, [address] [place],  [place] [id number] PCP: Scot Jun, FNP    Visit Diagnoses:  1. Cervicalgia   2. Cervical cord myelomalacia (HCC)     Plan: Patient's x-ray shows significant progression of spondylosis with multilevel spurring.  With her previous cord myelomalacia she needs a new MRI and is at risk for neurologic deficit if she gets further cord pressure.  Office follow-up after MRI scan for review.  Pathophysiology discussed multiple questions were answered.  Follow-Up Instructions: office return after cervical MRI  Orders:  Orders Placed This Encounter  Procedures  . XR Cervical Spine 2 or 3 views  . MR Cervical Spine w/o contrast   No orders of the defined types were placed in this encounter.     Procedures: No procedures performed   Clinical Data: No additional findings.   Subjective: Chief Complaint  Patient presents with  . Neck - Pain    HPI 53 year old female seen with multiyear history of neck pain which is been more severe the last 6 months.  Past history on-the-job injury 2003 when she had some cord myelomalacia at C6-7 with disc protrusion and some stenosis at C5-6.  She also had some disc protrusion at C3-4 and was treated conservatively with some improvement.  She has had bilateral arm numbness tingling no gait disturbance.  She has to be extremely careful when she turns her neck she does not wanting body to touch her massage or manipulate her neck due to severe pain.  She is used ibuprofen without relief.  At the time of her scan where she had myelomalacia changes at C6-7 she did not have cord compression but did have moderate spondylosis at that time.  Patient had noticed increased symptoms when she is working out with 4 pound weights in her hands.   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  Otherwise noncontributory 14 point systems update.   Objective: Vital Signs: Ht 5\' 4"  (1.626 m)   Wt 194 lb (88 kg)   BMI 33.30 kg/m   Physical Exam Constitutional:      Appearance: She is well-developed.  HENT:     Head: Normocephalic.     Right Ear: External ear normal.     Left Ear: External ear normal.  Eyes:     Pupils: Pupils are equal, round, and reactive to light.  Neck:     Thyroid: No thyromegaly.     Trachea: No tracheal deviation.  Cardiovascular:     Rate and Rhythm: Normal rate.  Pulmonary:     Effort: Pulmonary effort is normal.  Abdominal:     Palpations: Abdomen is soft.  Skin:    General: Skin is warm and dry.  Neurological:     Mental Status: She is alert and oriented to person, place, and time.  Psychiatric:        Behavior: Behavior normal.     Ortho Exam patient has some decreased sensation of her hand primarily radial more than ulnar side both hands.  Biceps triceps has fairly good resistance testing but she complains of  sharp neck pain.  She can get her arms gently overhead.  Severe brachial plexus tenderness right and left.  Reflexes are 3+ and symmetrical.  3+ lower extremity reflexes. Positive Lhermitte, bilateral positive Spurling.  No biceps triceps atrophy.  Interossei are strong Specialty Comments:  No specialty comments available.  Imaging: PLEASE SEE END OF REPORT FOR CORRECTIONS CLINICAL DATA:  NECK PAIN RADIATING INTO THE RIGHT ARM.  EVALUATE FOR C5-6 DISK HERNIATION. MRI OF THE CERVICAL SPINE WITHOUT CONTRAST CONTRAST MULTIPLANAR T1 AND T2-WEIGHTED IMAGING WAS PERFORMED. THERE IS NO ABNORMALITY OF THE FORAMEN MAGNUM, C1-2, OR C2-3. AT C3-4, THERE IS A MODERATE SIZED RIGHT POSTEROLATERAL DISK HERNIATION.  THIS EFFACES THE VENTRAL SUBARACHNOID SPACE BUT DOES NOT APPEAR TO COMPRESS OR DEFORM THE CORD.  NO FORAMINAL  EXTENSION IS SEEN. AT C4-5, THE DISK IS NORMAL.  THE CANAL AND FORAMINA ARE WIDELY PATENT. AT C5-6, THE DISK IS DEGENERATED.  THERE IS A CENTRAL TO LEFT POSTEROLATERAL DISK HERNIATION ASSOCIATED WITH OSTEOPHYTIC SPURRING.  THE SPINAL CANAL IS NARROWED ON THE LEFT AND THE LEFT SIDE OF THE CORD IS FLATTENED SLIGHTLY.  THERE IS MILD NEURAL FORAMINAL NARROWING ON THE RIGHT AND MODERATE NEURAL FORAMINAL NARROWING ON THE LEFT DUE TO OSTEOPHYTIC DISEASE. AT C6-7, THERE IS MILD SPONDYLOSIS WITH SMALL OSTEOPHYTES COVERING BULGING DISK MATERIAL.  THE CANAL IS NARROWED BUT THE CORD DOES NOT COMPRESS OR DEFORM.  THERE IS SOME ABNORMAL T2 SIGNAL WITHIN THE CORD AT THIS LEVEL CONSISTENT WITH A FOCUS OF MYELOPATHY.  THIS IS SOMEWHAT SURPRISING GIVEN THE FACT THAT THERE DOES NOT APPEAR TO BE BE GROSSLY COMPRESSIVE PATHOLOGY AT THIS LEVEL AT THIS MOMENT. AT C7-T1, T1-2, AND T2-3, THE DISKS ARE NORMAL.  THE CANAL AND FORAMINA ARE WIDELY PATENT. IMPRESSION MODERATE SIZE RIGHT POSTEROLATERAL DISK HERNIATION AT L3-4 WHICH EFFACES THE SUBARACHNOID SPACE BUT DOES NOT GROSSLY COMPRESS OR DEFORM THE CORD. FAIRLY PRONOUNCED SPONDYLOSIS AT C5-6 WITH POSTERIORLY PROJECTING OSTEOPHYTES AND PROTRUDING DISK MATERIAL MORE PRONOUNCED TO THE LEFT.  THE LEFT SIDE OF THE CORD IS FLATTENED SLIGHTLY.  THERE IS OSTEOPHYTIC ENCROACHMENT UPON THE NEURAL FORAMINA, MORE ON THE LEFT THAN ON THE RIGHT.  I UNDERSTAND THAT THE PATIENT DESCRIBES RIGHT SIDED SYMPTOMS, HOWEVER. MODERATE SPONDYLOSIS AT C6-7.  THE PATIENT DOES NOT HAVE SOME ABNORMAL T2 SIGNAL WITHIN THE CORD AT THIS LEVEL CONSISTENT WITH MYELOPATHIC CHANGE.  THIS DOES NOT APPEAR TO BE GROSSLY COMPRESSIVE PATHOLOGY AT THIS LEVEL AND IT IS POSSIBLE THAT THIS DERIVES FROM THE PATHOLOGY AT C5-6. PLEASE NOTE THE FOLLOWING CORRECTIONS: IMPRESSION 1.  MODERATE SIZE RIGHT POSTEROLATERAL DISK HERNIATION AT C3-4 WHICH EFFACES THE SUBARACHNOID SPACE BUT DOES NOT GROSSLY COMPRESS OR  DEFORM THE CORD. 3.  MODERATE SPONDYLOSIS AT C6-7.  THE PATIENT DOES HAVE SOME ABNORMAL T2 SIGNAL WITHIN THE CORD AT THIS LEVEL CONSISTENT WITH MYELOPATHIC CHANGE.  THERE DOES NOT APPEAR TO BE GROSSLY COMPRESSIVE PATHOLOGY AT THIS LEVEL AND IT IS POSSIBLE THAT THIS DERIVES FROM THE PATHOLOGY AT C5-6.    PMFS History: Patient Active Problem List   Diagnosis Date Noted  . Cervical cord myelomalacia ([place]) 01/14/2019   Past Medical History:  Diagnosis Date  . Allergy   . Mixed hyperlipidemia   . Nut allergy    [place] nut    Family History  Problem Relation Age of Onset  . Diabetes Mother   . Kidney Stones Mother   . Heart disease Mother   . Cancer Father   . Prostate cancer Father   . Diabetes Maternal Grandmother   .  Heart disease Maternal Grandmother   . Hypertension Maternal Grandmother   . Diabetes Maternal Grandfather   . Heart disease Maternal Grandfather   . Hypertension Maternal Grandfather   . Colon cancer Paternal Grandmother   . Hypertension Paternal Grandmother     Past Surgical History:  Procedure Laterality Date  . EYE SURGERY     tear duct repair  . TUBAL LIGATION     Social History   Occupational History  . Not on file  Tobacco Use  . Smoking status: Never Smoker  . Smokeless tobacco: Never Used  Substance and Sexual Activity  . Alcohol use: No  . Drug use: No  . Sexual activity: Not on file

## 2019-02-11 ENCOUNTER — Other Ambulatory Visit: Payer: 59

## 2019-02-14 ENCOUNTER — Other Ambulatory Visit: Payer: Self-pay

## 2019-02-14 ENCOUNTER — Ambulatory Visit (HOSPITAL_COMMUNITY)
Admission: EM | Admit: 2019-02-14 | Discharge: 2019-02-14 | Disposition: A | Discharge status: Home / Self Care | Payer: 59 | Attending: Emergency Medicine | Admitting: Emergency Medicine

## 2019-02-14 ENCOUNTER — Encounter (HOSPITAL_COMMUNITY): Payer: Self-pay | Admitting: Emergency Medicine

## 2019-02-14 DIAGNOSIS — S39012A Strain of muscle, fascia and tendon of lower back, initial encounter: Secondary | ICD-10-CM

## 2019-02-14 MED ORDER — CYCLOBENZAPRINE HCL 5 MG PO TABS: 5.0000 mg | ORAL_TABLET | Freq: Two times a day (BID) | ORAL | 0 refills | Status: DC | PRN
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
## 2019-02-14 NOTE — Discharge Instructions (Addendum)
Take muscle relaxer as directed. Do exercises/stretches provided. May take OTC tylenol/ibuprofen as needed. Return for worsening pain, numbness/weakness in arms/legs, or loss of bowel/bladder control.

## 2019-02-15 ENCOUNTER — Encounter (HOSPITAL_COMMUNITY): Payer: Self-pay | Admitting: Emergency Medicine

## 2019-02-17 ENCOUNTER — Telehealth: Payer: Self-pay | Admitting: Family Medicine

## 2019-02-17 NOTE — Telephone Encounter (Signed)
Spoke with patient. She states that she was advised by a nurse at [facility] that she needed to be seen by her PCP within 4 hours based on her symptoms. I apologized & let her know that that wouldn't be possible b/c her PCP no longer worked at this office. I did let her know that the urgent care beside [place] is open & that she was welcome to be seen there for evaluation of her symptoms.
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
## 2019-02-18 ENCOUNTER — Ambulatory Visit: Payer: 59 | Admitting: Orthopaedic Surgery

## 2019-02-19 ENCOUNTER — Other Ambulatory Visit: Payer: Self-pay

## 2019-02-19 ENCOUNTER — Emergency Department (HOSPITAL_COMMUNITY): Payer: 59

## 2019-02-19 ENCOUNTER — Encounter (HOSPITAL_COMMUNITY): Payer: Self-pay | Admitting: Emergency Medicine

## 2019-02-19 ENCOUNTER — Emergency Department (HOSPITAL_COMMUNITY)
Admission: EM | Admit: 2019-02-19 | Discharge: 2019-02-19 | Disposition: A | Discharge status: Home / Self Care | Payer: 59 | Attending: Emergency Medicine | Admitting: Emergency Medicine

## 2019-02-19 DIAGNOSIS — M542 Cervicalgia: Secondary | ICD-10-CM

## 2019-02-19 DIAGNOSIS — Z79899 Other long term (current) drug therapy: Secondary | ICD-10-CM | POA: Insufficient documentation

## 2019-02-19 DIAGNOSIS — M62838 Other muscle spasm: Secondary | ICD-10-CM | POA: Diagnosis not present

## 2019-02-19 LAB — CBC WITH DIFFERENTIAL/PLATELET
Abs Immature Granulocytes: 0.01 10*3/uL (ref 0.00–0.07)
Basophils Absolute: 0 10*3/uL (ref 0.0–0.1)
Basophils Relative: 1 %
Eosinophils Absolute: 0.3 10*3/uL (ref 0.0–0.5)
Eosinophils Relative: 5 %
HCT: 39.6 % (ref 36.0–46.0)
Hemoglobin: 13.1 g/dL (ref 12.0–15.0)
Immature Granulocytes: 0 %
Lymphocytes Relative: 36 %
Lymphs Abs: 2 10*3/uL (ref 0.7–4.0)
MCH: 29.6 pg (ref 26.0–34.0)
MCHC: 33.1 g/dL (ref 30.0–36.0)
MCV: 89.6 fL (ref 80.0–100.0)
Monocytes Absolute: 0.6 10*3/uL (ref 0.1–1.0)
Monocytes Relative: 11 %
Neutro Abs: 2.7 10*3/uL (ref 1.7–7.7)
Neutrophils Relative %: 47 %
Platelets: 279 10*3/uL (ref 150–400)
RBC: 4.42 MIL/uL (ref 3.87–5.11)
RDW: 12.5 % (ref 11.5–15.5)
WBC: 5.6 10*3/uL (ref 4.0–10.5)
nRBC: 0 % (ref 0.0–0.2)

## 2019-02-19 LAB — I-STAT BETA HCG BLOOD, ED (MC, WL, AP ONLY): I-stat hCG, quantitative: <5 m[IU]/mL (ref <5)

## 2019-02-19 LAB — COMPREHENSIVE METABOLIC PANEL
ALT: 20 U/L (ref 0–44)
AST: 20 U/L (ref 15–41)
Albumin: 4.1 g/dL (ref 3.5–5.0)
Alkaline Phosphatase: 42 U/L (ref 38–126)
Anion gap: 10 (ref 5–15)
BUN: 13 mg/dL (ref 6–20)
CO2: 26 mmol/L (ref 22–32)
Calcium: 9.2 mg/dL (ref 8.9–10.3)
Chloride: 102 mmol/L (ref 98–111)
Creatinine, Ser: 0.72 mg/dL (ref 0.44–1.00)
GFR calc Af Amer: >60 mL/min (ref >60)
GFR calc non Af Amer: >60 mL/min (ref >60)
Glucose, Bld: 123 mg/dL — ABNORMAL HIGH (ref 70–99)
Potassium: 3.7 mmol/L (ref 3.5–5.1)
Sodium: 138 mmol/L (ref 135–145)
Total Bilirubin: 0.6 mg/dL (ref 0.3–1.2)
Total Protein: 7.4 g/dL (ref 6.5–8.1)

## 2019-02-19 MED ORDER — CYCLOBENZAPRINE HCL 10 MG PO TABS: 10.0000 mg | ORAL_TABLET | Freq: Two times a day (BID) | ORAL | 0 refills | Status: AC | PRN
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
## 2019-02-19 NOTE — ED Notes (Signed)
Pt refused BP cuff. Stating that it hurts. BP cuff reposition twice by RN

## 2019-02-19 NOTE — ED Notes (Signed)
Discharge instructions discussed with pt. Pt verbalized understanding. No questions at this time.   Pt not available for VS

## 2019-02-21 ENCOUNTER — Telehealth: Payer: Self-pay

## 2019-02-21 NOTE — Telephone Encounter (Signed)

## 2019-02-22 ENCOUNTER — Other Ambulatory Visit: Payer: 59

## 2019-02-24 ENCOUNTER — Ambulatory Visit: Payer: 59 | Admitting: Family Medicine

## 2019-02-25 ENCOUNTER — Ambulatory Visit: Payer: 59 | Admitting: Orthopaedic Surgery

## 2019-03-01 ENCOUNTER — Other Ambulatory Visit: Payer: Self-pay

## 2019-03-01 ENCOUNTER — Ambulatory Visit
Admission: RE | Admit: 2019-03-01 | Discharge: 2019-03-01 | Disposition: A | Discharge status: Home / Self Care | Payer: 59 | Source: Ambulatory Visit | Attending: Orthopaedic Surgery | Admitting: Orthopaedic Surgery

## 2019-03-01 DIAGNOSIS — M542 Cervicalgia: Secondary | ICD-10-CM

## 2019-03-05 ENCOUNTER — Ambulatory Visit (INDEPENDENT_AMBULATORY_CARE_PROVIDER_SITE_OTHER): Payer: 59 | Admitting: Orthopaedic Surgery

## 2019-03-05 ENCOUNTER — Encounter: Payer: Self-pay | Admitting: Orthopaedic Surgery

## 2019-03-05 VITALS — BP 147/94 | HR 107 | Ht 64.0 in | Wt 194.0 lb

## 2019-03-05 DIAGNOSIS — G9589 Other specified diseases of spinal cord: Secondary | ICD-10-CM

## 2019-03-05 DIAGNOSIS — M4802 Spinal stenosis, cervical region: Secondary | ICD-10-CM | POA: Diagnosis not present
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
## 2019-03-06 ENCOUNTER — Other Ambulatory Visit: Payer: Self-pay

## 2019-03-11 ENCOUNTER — Telehealth: Payer: Self-pay | Admitting: Orthopaedic Surgery

## 2019-03-11 ENCOUNTER — Telehealth: Payer: Self-pay | Admitting: Neurology

## 2019-03-11 ENCOUNTER — Institutional Professional Consult (permissible substitution): Payer: 59 | Admitting: Neurology

## 2019-03-11 ENCOUNTER — Encounter: Payer: Self-pay | Admitting: Neurology

## 2019-03-11 NOTE — Telephone Encounter (Signed)
FYI

## 2019-03-11 NOTE — Telephone Encounter (Signed)
Patient was a no show to sleep consult apt today

## 2019-03-11 NOTE — Telephone Encounter (Signed)
Left message for patient to call in reference to scheduling her cervical fusion with Dr. Lorin Mercy at [facility]. Her insurance [facility]) has approved her surgery. Provided patient with name and direct number for scheduling.

## 2019-03-11 NOTE — Telephone Encounter (Signed)
I called her. ?'s answered . She is ready to schedule but nervous about anesthesia. Jackelyn Poling can call her

## 2019-03-20 ENCOUNTER — Encounter: Payer: Self-pay | Admitting: Surgery

## 2019-03-20 ENCOUNTER — Ambulatory Visit (INDEPENDENT_AMBULATORY_CARE_PROVIDER_SITE_OTHER): Payer: 59 | Admitting: Surgery

## 2019-03-20 VITALS — BP 137/89 | HR 92

## 2019-03-20 DIAGNOSIS — M4802 Spinal stenosis, cervical region: Secondary | ICD-10-CM

## 2019-03-25 ENCOUNTER — Telehealth: Payer: Self-pay | Admitting: Orthopaedic Surgery

## 2019-03-25 NOTE — Telephone Encounter (Signed)
Patient called. Would like to ask a few questions about her surgery. Her call back number is [phone]. Thanks

## 2019-03-25 NOTE — Progress Notes (Signed)
[facility] [address] [place] [address] Phone: [phone] Fax: [phone]      Your procedure is scheduled on Monday, March 31, 2019 at 12:30 PM.   Report to [facility] Entrance "A" at 10:30 AM, and check in at the Admitting office.   Call this number if you have problems the morning of surgery:  [phone]  Call [phone] if you have any questions prior to your surgery date Monday-Friday 8am-4pm    Remember:  Do not eat after midnight the night before your surgery  You may drink clear liquids until 9:30 AM the morning of your surgery.    Clear liquids allowed are: Water, Non-Citrus Juices (without pulp), Carbonated Beverages, Clear Tea, Black Coffee Only, and Gatorade  Please complete your PRE-SURGERY ENSURE that was provided to you by 9:30 AM  the morning of surgery.   Please, if able, drink it in one setting. DO NOT SIP.    Take these medicines the morning of surgery with A SIP OF WATER:  Cyclobenzaprine (Flexeril) - if needed  7 days prior to surgery STOP taking any Aspirin (unless otherwise instructed by your surgeon), Aleve, Naproxen, Ibuprofen, Motrin, Advil, Goody's, BC's, all herbal medications, fish oil, and all vitamins.    The Morning of Surgery  Do not wear jewelry, make-up or nail polish.  Do not wear lotions, powders, or perfumes/colognes, or deodorant  Do not shave 48 hours prior to surgery.   Do not bring valuables to the hospital.  [facility] is not responsible for any belongings or valuables.  Remember that you must have someone to transport you home after your surgery, and remain with you for 24 hours if you are discharged the same day.   Contacts, glasses, hearing aids, dentures or bridgework may not be worn into surgery.    Leave your suitcase in the car.  After surgery it may be brought to your room.  For patients admitted to the hospital,  discharge time will be determined by your treatment team.  Patients discharged the day of surgery will not be allowed to drive home.    Special instructions:   [facility]- Preparing For Surgery  Before surgery, you can play an important role. Because skin is not sterile, your skin needs to be as free of germs as possible. You can reduce the number of germs on your skin by washing with CHG (chlorahexidine gluconate) Soap before surgery.  CHG is an antiseptic cleaner which kills germs and bonds with the skin to continue killing germs even after washing.    Oral Hygiene is also important to reduce your risk of infection.  Remember - BRUSH YOUR TEETH THE MORNING OF SURGERY WITH YOUR REGULAR TOOTHPASTE  Please do not use if you have an allergy to CHG or antibacterial soaps. If your skin becomes reddened/irritated stop using the CHG.  Do not shave (including legs and underarms) for at least 48 hours prior to first CHG shower. It is OK to shave your face.  Please follow these instructions carefully.   1. Shower the NIGHT BEFORE SURGERY and the MORNING OF SURGERY with CHG Soap.   2. If you chose to wash your hair, wash your hair first as usual with your normal shampoo.  3. After you shampoo, rinse your hair and body thoroughly to remove the shampoo.  4. Use CHG as you would any other liquid soap. You can apply CHG  directly to the skin and wash gently with a scrungie or a clean washcloth.   5. Apply the CHG Soap to your body ONLY FROM THE NECK DOWN.  Do not use on open wounds or open sores. Avoid contact with your eyes, ears, mouth and genitals (private parts). Wash Face and genitals (private parts)  with your normal soap.   6. Wash thoroughly, paying special attention to the area where your surgery will be performed.  7. Thoroughly rinse your body with warm water from the neck down.  8. DO NOT shower/wash with your normal soap after using and rinsing off the CHG Soap.  9. Pat yourself dry  with a CLEAN TOWEL.  10. Wear CLEAN PAJAMAS to bed the night before surgery, wear comfortable clothes the morning of surgery  11. Place CLEAN SHEETS on your bed the night of your first shower and DO NOT SLEEP WITH PETS.    Day of Surgery:  Do not apply any deodorants/lotions. Please shower the morning of surgery with the CHG soap  Please wear clean clothes to the hospital/surgery center.   Remember to brush your teeth WITH YOUR REGULAR TOOTHPASTE.   Please read over the following fact sheets that you were given.

## 2019-03-25 NOTE — Telephone Encounter (Signed)
I called discussed. ?'s answered.

## 2019-03-25 NOTE — Telephone Encounter (Signed)
Patient was sent a video of a cervical fusion operation and has many questions. She would like to know what exactly you are grafting with as she does not want bone removed from her hip. She also has questions regarding complications regarding blood clots and nerve damage. She plans to have her family with her and states that she was told that she could have one person in recovery. She would like to know if they can change in and out so that she can see all of her family. I advised that the last I heard, due to COVID-19, only one person was allowed in the hospital with the patient. She would like to know if this is still the case. She would like to see testimonials from patients about a year after the same procedure and see how they are doing. I was not aware of a certain site she could look at.  She states that she also saw video on several types of procedures and would like to know if this is the only surgery you would recommend or if there are other types that may help her.  I did not have answers to many of the questions patient has. Could you please call her?

## 2019-03-26 ENCOUNTER — Encounter (HOSPITAL_COMMUNITY)
Admission: RE | Admit: 2019-03-26 | Discharge: 2019-03-26 | Disposition: A | Discharge status: Home / Self Care | Payer: 59 | Source: Ambulatory Visit | Attending: Orthopaedic Surgery | Admitting: Orthopaedic Surgery

## 2019-03-26 ENCOUNTER — Other Ambulatory Visit: Payer: Self-pay

## 2019-03-26 ENCOUNTER — Ambulatory Visit (HOSPITAL_COMMUNITY)
Admission: RE | Admit: 2019-03-26 | Discharge: 2019-03-26 | Disposition: A | Discharge status: Home / Self Care | Payer: 59 | Source: Ambulatory Visit | Attending: Surgery | Admitting: Surgery

## 2019-03-26 ENCOUNTER — Encounter (HOSPITAL_COMMUNITY): Payer: Self-pay

## 2019-03-26 DIAGNOSIS — Z01818 Encounter for other preprocedural examination: Secondary | ICD-10-CM | POA: Insufficient documentation

## 2019-03-26 LAB — URINALYSIS, ROUTINE W REFLEX MICROSCOPIC
Bilirubin Urine: NEGATIVE
Glucose, UA: NEGATIVE mg/dL
Hgb urine dipstick: NEGATIVE
Ketones, ur: NEGATIVE mg/dL
Leukocytes,Ua: NEGATIVE
Nitrite: NEGATIVE
Protein, ur: NEGATIVE mg/dL
Specific Gravity, Urine: 1.023 (ref 1.005–1.030)
pH: 5 (ref 5.0–8.0)

## 2019-03-26 LAB — COMPREHENSIVE METABOLIC PANEL
ALT: 17 U/L (ref 0–44)
AST: 17 U/L (ref 15–41)
Albumin: 4.2 g/dL (ref 3.5–5.0)
Alkaline Phosphatase: 39 U/L (ref 38–126)
Anion gap: 7 (ref 5–15)
BUN: 8 mg/dL (ref 6–20)
CO2: 26 mmol/L (ref 22–32)
Calcium: 9.5 mg/dL (ref 8.9–10.3)
Chloride: 105 mmol/L (ref 98–111)
Creatinine, Ser: 0.66 mg/dL (ref 0.44–1.00)
GFR calc Af Amer: >60 mL/min (ref >60)
GFR calc non Af Amer: >60 mL/min (ref >60)
Glucose, Bld: 105 mg/dL — ABNORMAL HIGH (ref 70–99)
Potassium: 3.8 mmol/L (ref 3.5–5.1)
Sodium: 138 mmol/L (ref 135–145)
Total Bilirubin: 0.4 mg/dL (ref 0.3–1.2)
Total Protein: 7.6 g/dL (ref 6.5–8.1)

## 2019-03-26 LAB — SURGICAL PCR SCREEN
MRSA, PCR: NEGATIVE
Staphylococcus aureus: NEGATIVE

## 2019-03-26 LAB — CBC
HCT: 40.3 % (ref 36.0–46.0)
Hemoglobin: 13.1 g/dL (ref 12.0–15.0)
MCH: 29.2 pg (ref 26.0–34.0)
MCHC: 32.5 g/dL (ref 30.0–36.0)
MCV: 90 fL (ref 80.0–100.0)
Platelets: 258 10*3/uL (ref 150–400)
RBC: 4.48 MIL/uL (ref 3.87–5.11)
RDW: 12.2 % (ref 11.5–15.5)
WBC: 6.2 10*3/uL (ref 4.0–10.5)
nRBC: 0 % (ref 0.0–0.2)

## 2019-03-26 NOTE — Progress Notes (Signed)
PCP - Sebastian Ache, [facility] Cardiologist - denies  Chest x-ray - 03/26/2019 EKG - 03/26/2019 Stress Test - denies ECHO - denies Cardiac Cath - denies  Sleep Study - denies; however patient says she stops breathing during sleep as observed; records indicate patient had a sleep study ordered for 03/11/2019 and patient was a no-show CPAP - denies  Blood Thinner Instructions: N/A Aspirin Instructions: N/A  Anesthesia review:  No  Patient denies shortness of breath, fever, cough and chest pain at PAT appointment  COVID test scheduled 03/27/2019; patient states verbal understanding of quarantine guidelines post testing.  Please remind your patients and families that hospital visitation restrictions are in effect and the importance of the restrictions.    Patient verbalized understanding of instructions that were given to them at the PAT appointment. Patient was also instructed that they will need to review over the PAT instructions again at home before surgery.

## 2019-03-27 ENCOUNTER — Other Ambulatory Visit (HOSPITAL_COMMUNITY)
Admission: RE | Admit: 2019-03-27 | Discharge: 2019-03-27 | Disposition: A | Discharge status: Home / Self Care | Payer: 59 | Source: Ambulatory Visit | Attending: Orthopaedic Surgery | Admitting: Orthopaedic Surgery

## 2019-03-27 ENCOUNTER — Other Ambulatory Visit (HOSPITAL_COMMUNITY): Payer: 59

## 2019-03-27 DIAGNOSIS — Z20828 Contact with and (suspected) exposure to other viral communicable diseases: Secondary | ICD-10-CM | POA: Insufficient documentation

## 2019-03-27 DIAGNOSIS — Z01812 Encounter for preprocedural laboratory examination: Secondary | ICD-10-CM | POA: Diagnosis present

## 2019-03-28 LAB — NOVEL CORONAVIRUS, NAA (HOSP ORDER, SEND-OUT TO REF LAB; TAT 18-24 HRS): SARS-CoV-2, NAA: NOT DETECTED

## 2019-03-29 ENCOUNTER — Encounter (HOSPITAL_COMMUNITY): Payer: Self-pay

## 2019-03-31 ENCOUNTER — Telehealth: Payer: Self-pay | Admitting: Orthopaedic Surgery

## 2019-03-31 ENCOUNTER — Encounter (HOSPITAL_COMMUNITY): Admission: RE | Payer: Self-pay | Source: Home / Self Care

## 2019-03-31 ENCOUNTER — Ambulatory Visit (HOSPITAL_COMMUNITY): Admission: RE | Admit: 2019-03-31 | Payer: 59 | Source: Home / Self Care | Admitting: Orthopaedic Surgery

## 2019-03-31 SURGERY — ANTERIOR CERVICAL DECOMPRESSION/DISCECTOMY FUSION 2 LEVELS
Anesthesia: General

## 2019-03-31 NOTE — Progress Notes (Signed)
Patient called and stated that they were canceling.  Instructed patient to call the office.  Nurse called and spoke with Malachy Mood at the office.  OR desk notified

## 2019-03-31 NOTE — Telephone Encounter (Signed)
Patient left message on voicemail at 8:15am today, stating she had called the hospital ([facility]) this morning to cancel her 12:30pm surgery today and would like me to return her call to discuss rescheduling.  I returned patient's call to a new number provided in her voicemail.  She states that she cancelled the surgery because of a family emergency and isn't spiritually or mentally ready for the surgery.  She is still asking questions in regards to waiting until November to have surgery.  She has chosen 05-26-19 as her new surgery date and was advised to call the office if her condition notably worsens.  Patient can be reached at the following number:    [phone]

## 2019-03-31 NOTE — Telephone Encounter (Signed)
FYI

## 2019-04-09 ENCOUNTER — Inpatient Hospital Stay: Payer: 59 | Admitting: Orthopaedic Surgery

## 2019-05-01 DIAGNOSIS — M50321 Other cervical disc degeneration at C4-C5 level: Secondary | ICD-10-CM | POA: Insufficient documentation

## 2019-05-01 DIAGNOSIS — M5030 Other cervical disc degeneration, unspecified cervical region: Secondary | ICD-10-CM | POA: Insufficient documentation

## 2019-05-19 ENCOUNTER — Telehealth: Payer: Self-pay | Admitting: Radiology

## 2019-05-19 NOTE — Telephone Encounter (Signed)
Patient called triage line this morning to cancel surgery for 05/26/2019. She states that she will have to call back to reschedule.

## 2019-05-22 ENCOUNTER — Other Ambulatory Visit (HOSPITAL_COMMUNITY): Payer: 59

## 2019-05-26 ENCOUNTER — Ambulatory Visit: Admit: 2019-05-26 | Payer: 59 | Admitting: Orthopaedic Surgery

## 2019-05-26 SURGERY — ANTERIOR CERVICAL DECOMPRESSION/DISCECTOMY FUSION 2 LEVELS
Anesthesia: General

## 2019-06-03 ENCOUNTER — Inpatient Hospital Stay: Payer: 59 | Admitting: Orthopaedic Surgery

## 2019-08-13 ENCOUNTER — Other Ambulatory Visit: Payer: Self-pay | Admitting: Neurosurgery

## 2019-09-12 ENCOUNTER — Other Ambulatory Visit
Admission: RE | Admit: 2019-09-12 | Discharge: 2019-09-12 | Disposition: A | Discharge status: Home / Self Care | Payer: 59 | Source: Ambulatory Visit | Attending: Neurosurgery | Admitting: Neurosurgery

## 2019-09-12 NOTE — Pre-Procedure Instructions (Signed)
Called several numbers provided in the contact list and the patient's mother answered and said she thought the patient was going to cancel her surgery.  I asked her to get a message to the patient and have her call us back to do her interview before her surgery or call the surgeon's office for them to cancel the surgery.

## 2019-09-15 ENCOUNTER — Other Ambulatory Visit: Payer: 59

## 2019-09-15 ENCOUNTER — Other Ambulatory Visit (HOSPITAL_COMMUNITY): Admission: RE | Admit: 2019-09-15 | Payer: 59 | Source: Ambulatory Visit

## 2019-09-17 ENCOUNTER — Encounter: Admission: RE | Payer: Self-pay | Source: Ambulatory Visit

## 2019-09-17 ENCOUNTER — Inpatient Hospital Stay: Admission: RE | Admit: 2019-09-17 | Payer: 59 | Source: Ambulatory Visit | Admitting: Neurosurgery

## 2019-09-17 SURGERY — ANTERIOR CERVICAL CORPECTOMY
Anesthesia: General

## 2019-09-30 ENCOUNTER — Ambulatory Visit (HOSPITAL_COMMUNITY)
Admission: EM | Admit: 2019-09-30 | Discharge: 2019-09-30 | Disposition: A | Discharge status: Home / Self Care | Payer: 59 | Attending: Family Medicine | Admitting: Family Medicine

## 2019-09-30 ENCOUNTER — Other Ambulatory Visit: Payer: Self-pay

## 2019-09-30 DIAGNOSIS — R000 Tachycardia, unspecified: Secondary | ICD-10-CM

## 2019-09-30 DIAGNOSIS — R5383 Other fatigue: Secondary | ICD-10-CM | POA: Diagnosis not present

## 2019-09-30 DIAGNOSIS — F411 Generalized anxiety disorder: Secondary | ICD-10-CM | POA: Diagnosis not present

## 2019-09-30 NOTE — Discharge Instructions (Addendum)
Your EKG was normal today.  Continue to monitor your blood pressure.   Continue to take care of yourself and prepare for surgery.  Go to the ER for trouble swallowing, trouble breathing, or other concerning symptoms.

## 2019-09-30 NOTE — ED Triage Notes (Addendum)
Pt c/o htn this morning of 180/100 and states she has been feeling like she was in a "fog" since Friday that resolved Saturday. States BP was in 150s/80s over the weekend. Also reports general weakness, sluggishness and neck pain left side.  Reports tingling in hands, and numbness/tingling to LLE but states she has history of same 2/2 "spinal issue".    Denies CP, nausea, diaphoresis, slurred speech. Smile symmetrical, grips equal/strong.   States she has been under stress lately 2/2 anxiety of upcoming neck surgery this morning. VS stable.

## 2019-09-30 NOTE — ED Provider Notes (Signed)
[facility]    CSN: [id number] Arrival date & time: 09/30/19  0802      History   Chief Complaint Chief Complaint  Patient presents with  . Hypertension  . Fatigue    HPI Beverly Hughes is a 54 y.o. female.   Reports that she had an episode of high blood pressure this morning at home.  Reports that she has a spinal fusion, and, and that this is making her very anxious.  Reports heart palpitations for the last 2 days.  Reports neck pain, neuropathy to upper extremities, this is not new.  This is what she is having surgery for she states.  Reports fatigue.  Denies headache, fever, nausea, vomiting, diarrhea, shortness of breath, sore throat, rash, other symptoms.  ROS per HPI  The history is provided by the patient.    Past Medical History:  Diagnosis Date  . Allergy   . Mixed hyperlipidemia   . Nut allergy    [place] nut    Patient Active Problem List   Diagnosis Date Noted  . Spinal stenosis of cervical region 03/05/2019  . Cervical cord myelomalacia ([place]) 01/14/2019    Past Surgical History:  Procedure Laterality Date  . EYE SURGERY     tear duct repair  . FRACTURE SURGERY     left foot fracture at 54 yrs old  . TUBAL LIGATION      OB History   No obstetric history on file.      Home Medications    Prior to Admission medications   Medication Sig Start Date End Date Taking? Authorizing Provider  cyclobenzaprine (FLEXERIL) 10 MG tablet Take 10 mg by mouth 2 (two) times daily as needed for muscle spasms.    Yes [provider]  ECHINACEA EXTRACT PO Take 1 tablet by mouth 2 (two) times daily.    [provider]  ibuprofen (ADVIL,MOTRIN) 200 MG tablet Take 400 mg by mouth every 8 (eight) hours as needed.     [provider]  Multiple Minerals-Vitamins (CAL MAG ZINC +D3 PO) Take 1 tablet by mouth 3 (three) times daily with meals.    [provider]    Family History Family History  Problem Relation Age of  Onset  . Diabetes Mother   . Kidney Stones Mother   . Heart disease Mother   . Cancer Father   . Prostate cancer Father   . Diabetes Maternal Grandmother   . Heart disease Maternal Grandmother   . Hypertension Maternal Grandmother   . Diabetes Maternal Grandfather   . Heart disease Maternal Grandfather   . Hypertension Maternal Grandfather   . Colon cancer Paternal Grandmother   . Hypertension Paternal Grandmother     Social History Social History   Tobacco Use  . Smoking status: Never Smoker  . Smokeless tobacco: Never Used  Substance Use Topics  . Alcohol use: Yes    Alcohol/week: 2.0 standard drinks    Types: 1 Cans of beer, 1 Glasses of wine per week    Comment: socially  . Drug use: No     Allergies   Other and Codeine sulfate   Review of Systems Review of Systems   Physical Exam Triage Vital Signs ED Triage Vitals  Enc Vitals Group     BP 09/30/19 0820 125/83     Pulse Rate 09/30/19 0820 (!) 102     Resp 09/30/19 0820 18     Temp 09/30/19 0820 98.7 F (37.1 C)  Temp Source 09/30/19 0820 Oral     SpO2 09/30/19 0820 99 %     Weight --      Height --      Head Circumference --      Peak Flow --      Pain Score 09/30/19 0817 4     Pain Loc --      Pain Edu? --      Excl. in [place]? --    No data found.  Updated Vital Signs BP 125/83 (BP Location: Left Arm)   Pulse (!) 102   Temp 98.7 F (37.1 C) (Oral)   Resp 18   LMP 09/01/2019   SpO2 99%   Visual Acuity Right Eye Distance:   Left Eye Distance:   Bilateral Distance:    Right Eye Near:   Left Eye Near:    Bilateral Near:     Physical Exam Vitals and nursing note reviewed.  Constitutional:      General: She is not in acute distress.    Appearance: Normal appearance. She is well-developed.  HENT:     Head: Normocephalic and atraumatic.  Eyes:     Conjunctiva/sclera: Conjunctivae normal.  Cardiovascular:     Rate and Rhythm: Normal rate and regular rhythm.     Heart sounds:  Normal heart sounds. No murmur.  Pulmonary:     Effort: Pulmonary effort is normal. No respiratory distress.     Breath sounds: Normal breath sounds. No stridor. No wheezing, rhonchi or rales.  Abdominal:     General: Bowel sounds are normal. There is no distension.     Palpations: Abdomen is soft. There is no mass.     Tenderness: There is no abdominal tenderness. There is no guarding or rebound.     Hernia: No hernia is present.  Musculoskeletal:     Cervical back: Neck supple.  Skin:    General: Skin is warm and dry.     Capillary Refill: Capillary refill takes less than 2 seconds.  Neurological:     General: No focal deficit present.     Mental Status: She is alert and oriented to person, place, and time.  Psychiatric:        Mood and Affect: Mood normal.        Behavior: Behavior normal.      UC Treatments / Results  Labs (all labs ordered are listed, but only abnormal results are displayed) Labs Reviewed - No data to display  EKG   Radiology No results found.  Procedures Procedures (including critical care time)  Medications Ordered in UC Medications - No data to display  Initial Impression / Assessment and Plan / UC Course  I have reviewed the triage vital signs and the nursing notes.  Pertinent labs & imaging results that were available during my care of the patient were reviewed by me and considered in my medical decision making (see chart for details).    Anxiety is likely the cause of her symptoms.  Blood pressure normal in office today.  Instructed on low-salt diet, relaxation techniques to help reduce blood pressure.  Heart palpitations, EKG normal in office today.  Fatigue and tachycardia likely also related to anxiety.  Instructed to follow-up with her surgeon and to take some time to take care of herself.  Go to the ER with trouble swallowing, trouble breathing, other concerning symptoms. Final Clinical Impressions(s) / UC Diagnoses   Final diagnoses:   Other fatigue  Tachycardia  Anxiety state  Discharge Instructions     Your EKG was normal today.  Continue to monitor your blood pressure.   Continue to take care of yourself and prepare for surgery.  Go to the ER for trouble swallowing, trouble breathing, or other concerning symptoms.     ED Prescriptions    None     PDMP not reviewed this encounter.   Faustino Congress, NP 09/30/19 [phone]

## 2019-10-01 ENCOUNTER — Other Ambulatory Visit: Payer: Self-pay | Admitting: Neurosurgery

## 2019-10-03 NOTE — Addendum Note (Signed)
Addended by: Meade Maw on: 10/03/2019 11:00 AM   Modules accepted: Orders

## 2019-10-07 ENCOUNTER — Other Ambulatory Visit: Payer: Self-pay

## 2019-10-07 ENCOUNTER — Encounter
Admission: RE | Admit: 2019-10-07 | Discharge: 2019-10-07 | Disposition: A | Discharge status: Home / Self Care | Payer: 59 | Source: Ambulatory Visit | Attending: Neurosurgery | Admitting: Neurosurgery

## 2019-10-07 DIAGNOSIS — Z01818 Encounter for other preprocedural examination: Secondary | ICD-10-CM | POA: Diagnosis not present

## 2019-10-07 NOTE — Patient Instructions (Signed)
Your procedure is scheduled on: Wednesday 10/15/19.  Report to DAY SURGERY DEPARTMENT LOCATED ON 2ND FLOOR MEDICAL MALL ENTRANCE. To find out your arrival time please call [phone] between 1PM - 3PM on Tuesday 10/14/19.  Remember: Instructions that are not followed completely may result in serious medical risk, up to and including death, or upon the discretion of your surgeon and anesthesiologist your surgery may need to be rescheduled.       _X__ 1. Do not eat food after midnight the night before your procedure.                 No gum chewing or hard candies. You may drink clear liquids up to 2 hours                 before you are scheduled to arrive for your surgery- DO NOT drink clear                 liquids within 2 hours of the start of your surgery.                 Clear Liquids include:  water, apple juice without pulp, clear carbohydrate                 drink such as Clearfast or Gatorade, Black Coffee or Tea (Do not add                 anything to coffee or tea).    __X__2.  On the morning of surgery brush your teeth with toothpaste and water, you may rinse your mouth with mouthwash if you wish.  Do not swallow any toothpaste or mouthwash.       _X__ 3.  No Alcohol for 24 hours before or after surgery.      __X__4.  Notify your doctor if there is any change in your medical condition      (cold, fever, infections).       Do not wear jewelry, make-up, hairpins, clips or nail polish. Do not wear lotions, powders, or perfumes.  Do not shave 48 hours prior to surgery. Men may shave face and neck. Do not bring valuables to the hospital.      [facility] is not responsible for any belongings or valuables.    Contacts, dentures/partials or body piercings may not be worn into surgery. Bring a case for your contacts, glasses or hearing aids, a denture cup will be supplied.    Patients discharged the day of surgery will not be allowed to drive home.     __X__ Take  these medicines the morning of surgery with A SIP OF WATER:     1. cyclobenzaprine (FLEXERIL)        __X__ Use CHG Soap as directed    __X__ Stop Anti-inflammatories 7 days before surgery such as Advil, Ibuprofen, Motrin, BC or Goodies Powder, Naprosyn, Naproxen, Aleve, Aspirin, Meloxicam. May take Tylenol if needed for pain or discomfort.     __X__ Don't start any new herbal supplements or vitamins prior to surgery.

## 2019-10-07 NOTE — Pre-Procedure Instructions (Signed)
Patient will have her pre-surgery Covid Test at the [facility] location on Saturday 10/11/19. She will have her pre-surgery labs done at the [facility] on Saturday 10/11/19 as well. Lab orders were faxed by Dr. Rhea Bleacher office today. Patient is very satisfied with this plan.

## 2019-10-11 ENCOUNTER — Other Ambulatory Visit (HOSPITAL_COMMUNITY): Payer: 59 | Attending: Neurosurgery

## 2019-10-13 ENCOUNTER — Other Ambulatory Visit: Payer: 59

## 2019-10-13 ENCOUNTER — Other Ambulatory Visit (HOSPITAL_COMMUNITY): Admission: RE | Admit: 2019-10-13 | Payer: 59 | Source: Ambulatory Visit

## 2019-10-13 ENCOUNTER — Other Ambulatory Visit: Payer: Self-pay

## 2019-10-13 ENCOUNTER — Encounter
Admission: RE | Admit: 2019-10-13 | Discharge: 2019-10-13 | Disposition: A | Discharge status: Home / Self Care | Payer: 59 | Source: Ambulatory Visit | Attending: Neurosurgery | Admitting: Neurosurgery

## 2019-10-13 ENCOUNTER — Other Ambulatory Visit (HOSPITAL_COMMUNITY): Payer: 59

## 2019-10-13 LAB — URINALYSIS, ROUTINE W REFLEX MICROSCOPIC
Bacteria, UA: NONE SEEN
Bilirubin Urine: NEGATIVE
Glucose, UA: NEGATIVE mg/dL
Hgb urine dipstick: NEGATIVE
Ketones, ur: NEGATIVE mg/dL
Leukocytes,Ua: NEGATIVE
Nitrite: NEGATIVE
Protein, ur: 30 mg/dL — AB
Specific Gravity, Urine: 1.027 (ref 1.005–1.030)
pH: 6 (ref 5.0–8.0)

## 2019-10-13 LAB — BASIC METABOLIC PANEL
Anion gap: 9 (ref 5–15)
BUN: 13 mg/dL (ref 6–20)
CO2: 27 mmol/L (ref 22–32)
Calcium: 9.4 mg/dL (ref 8.9–10.3)
Chloride: 103 mmol/L (ref 98–111)
Creatinine, Ser: 0.6 mg/dL (ref 0.44–1.00)
GFR calc Af Amer: >60 mL/min (ref >60)
GFR calc non Af Amer: >60 mL/min (ref >60)
Glucose, Bld: 94 mg/dL (ref 70–99)
Potassium: 3.7 mmol/L (ref 3.5–5.1)
Sodium: 139 mmol/L (ref 135–145)

## 2019-10-13 LAB — PROTIME-INR
INR: 1 (ref 0.8–1.2)
Prothrombin Time: 13 seconds (ref 11.4–15.2)

## 2019-10-13 LAB — APTT: aPTT: 28 seconds (ref 24–36)

## 2019-10-13 LAB — CBC
HCT: 39.6 % (ref 36.0–46.0)
Hemoglobin: 13.2 g/dL (ref 12.0–15.0)
MCH: 29.2 pg (ref 26.0–34.0)
MCHC: 33.3 g/dL (ref 30.0–36.0)
MCV: 87.6 fL (ref 80.0–100.0)
Platelets: 269 10*3/uL (ref 150–400)
RBC: 4.52 MIL/uL (ref 3.87–5.11)
RDW: 12.6 % (ref 11.5–15.5)
WBC: 6.8 10*3/uL (ref 4.0–10.5)
nRBC: 0 % (ref 0.0–0.2)

## 2019-10-13 LAB — TYPE AND SCREEN
ABO/RH(D): O POS
Antibody Screen: NEGATIVE

## 2019-10-13 LAB — SURGICAL PCR SCREEN
MRSA, PCR: NEGATIVE
Staphylococcus aureus: NEGATIVE

## 2019-10-13 LAB — SARS CORONAVIRUS 2 (TAT 6-24 HRS): SARS Coronavirus 2: NEGATIVE

## 2019-10-15 ENCOUNTER — Inpatient Hospital Stay
Admission: RE | Admit: 2019-10-15 | Discharge: 2019-10-17 | DRG: 472 | Disposition: A | Discharge status: Home Health | Payer: 59 | Attending: Neurosurgery | Admitting: Neurosurgery

## 2019-10-15 ENCOUNTER — Inpatient Hospital Stay: Payer: 59 | Admitting: Certified Registered Nurse Anesthetist

## 2019-10-15 ENCOUNTER — Inpatient Hospital Stay: Payer: 59

## 2019-10-15 ENCOUNTER — Encounter
Admission: RE | Disposition: A | Discharge status: Home / Self Care | Payer: Self-pay | Source: Ambulatory Visit | Attending: Neurosurgery

## 2019-10-15 ENCOUNTER — Encounter: Payer: Self-pay | Admitting: Neurosurgery

## 2019-10-15 ENCOUNTER — Other Ambulatory Visit: Payer: Self-pay

## 2019-10-15 DIAGNOSIS — Z885 Allergy status to narcotic agent status: Secondary | ICD-10-CM | POA: Diagnosis not present

## 2019-10-15 DIAGNOSIS — M62838 Other muscle spasm: Secondary | ICD-10-CM | POA: Diagnosis present

## 2019-10-15 DIAGNOSIS — Z79899 Other long term (current) drug therapy: Secondary | ICD-10-CM | POA: Diagnosis not present

## 2019-10-15 DIAGNOSIS — E785 Hyperlipidemia, unspecified: Secondary | ICD-10-CM | POA: Diagnosis present

## 2019-10-15 DIAGNOSIS — Z20822 Contact with and (suspected) exposure to covid-19: Secondary | ICD-10-CM | POA: Diagnosis present

## 2019-10-15 DIAGNOSIS — Z981 Arthrodesis status: Secondary | ICD-10-CM

## 2019-10-15 DIAGNOSIS — Z9851 Tubal ligation status: Secondary | ICD-10-CM

## 2019-10-15 DIAGNOSIS — M4712 Other spondylosis with myelopathy, cervical region: Principal | ICD-10-CM | POA: Diagnosis present

## 2019-10-15 DIAGNOSIS — M4802 Spinal stenosis, cervical region: Secondary | ICD-10-CM | POA: Diagnosis present

## 2019-10-15 DIAGNOSIS — G9589 Other specified diseases of spinal cord: Secondary | ICD-10-CM | POA: Diagnosis present

## 2019-10-15 DIAGNOSIS — M542 Cervicalgia: Secondary | ICD-10-CM | POA: Diagnosis present

## 2019-10-15 DIAGNOSIS — Z91018 Allergy to other foods: Secondary | ICD-10-CM

## 2019-10-15 DIAGNOSIS — Z419 Encounter for procedure for purposes other than remedying health state, unspecified: Secondary | ICD-10-CM

## 2019-10-15 HISTORY — PX: ANTERIOR CERVICAL CORPECTOMY: SHX1159

## 2019-10-15 HISTORY — PX: ANTERIOR CERVICAL DECOMP/DISCECTOMY FUSION: SHX1161

## 2019-10-15 LAB — ABO/RH: ABO/RH(D): O POS

## 2019-10-15 LAB — POCT PREGNANCY, URINE: Preg Test, Ur: NEGATIVE

## 2019-10-15 SURGERY — ANTERIOR CERVICAL CORPECTOMY
Anesthesia: General

## 2019-10-15 MED ORDER — FENTANYL CITRATE (PF) 100 MCG/2ML IJ SOLN
25.0000 ug | INTRAMUSCULAR | Status: AC | PRN
  Administered 2019-10-15 (×4): 25 ug via INTRAVENOUS

## 2019-10-15 MED ORDER — FAMOTIDINE 20 MG PO TABS: 20.0000 mg | ORAL_TABLET | Freq: Once | ORAL | Status: AC

## 2019-10-15 MED ORDER — FAMOTIDINE 20 MG PO TABS
ORAL_TABLET | ORAL | Status: AC
  Administered 2019-10-15: 20 mg via ORAL
  Filled 2019-10-15: qty 1

## 2019-10-15 MED ORDER — LACTATED RINGERS IV SOLN: INTRAVENOUS | Status: DC

## 2019-10-15 MED ORDER — PROPOFOL 10 MG/ML IV BOLUS
INTRAVENOUS | Status: AC
  Filled 2019-10-15: qty 20

## 2019-10-15 MED ORDER — OXYCODONE HCL 5 MG/5ML PO SOLN: 5.0000 mg | Freq: Once | ORAL | Status: DC | PRN

## 2019-10-15 MED ORDER — FENTANYL CITRATE (PF) 100 MCG/2ML IJ SOLN
INTRAMUSCULAR | Status: AC
  Administered 2019-10-15: 25 ug via INTRAVENOUS
  Filled 2019-10-15: qty 2

## 2019-10-15 MED ORDER — GELATIN ABSORBABLE 12-7 MM EX MISC
CUTANEOUS | Status: DC | PRN
  Administered 2019-10-15: 1 via TOPICAL

## 2019-10-15 MED ORDER — BUPIVACAINE-EPINEPHRINE 0.5% -1:200000 IJ SOLN
INTRAMUSCULAR | Status: DC | PRN
  Administered 2019-10-15: 10 mL

## 2019-10-15 MED ORDER — ACETAMINOPHEN 500 MG PO TABS
1000.0000 mg | ORAL_TABLET | Freq: Four times a day (QID) | ORAL | Status: AC
  Administered 2019-10-15 – 2019-10-16 (×4): 1000 mg via ORAL
  Filled 2019-10-15 (×4): qty 2

## 2019-10-15 MED ORDER — METHOCARBAMOL 500 MG PO TABS
500.0000 mg | ORAL_TABLET | Freq: Four times a day (QID) | ORAL | Status: DC
  Administered 2019-10-15 – 2019-10-17 (×8): 500 mg via ORAL
  Filled 2019-10-15 (×9): qty 1

## 2019-10-15 MED ORDER — PROPOFOL 500 MG/50ML IV EMUL
INTRAVENOUS | Status: AC
  Filled 2019-10-15: qty 50

## 2019-10-15 MED ORDER — ACETAMINOPHEN 10 MG/ML IV SOLN
INTRAVENOUS | Status: AC
  Filled 2019-10-15: qty 100

## 2019-10-15 MED ORDER — MIDAZOLAM HCL 2 MG/2ML IJ SOLN
INTRAMUSCULAR | Status: AC
  Filled 2019-10-15: qty 2

## 2019-10-15 MED ORDER — HYDROMORPHONE HCL 1 MG/ML IJ SOLN
0.5000 mg | INTRAMUSCULAR | Status: DC | PRN
  Administered 2019-10-16: 0.5 mg via INTRAVENOUS
  Filled 2019-10-15: qty 1

## 2019-10-15 MED ORDER — ONDANSETRON HCL 4 MG/2ML IJ SOLN
INTRAMUSCULAR | Status: AC
  Filled 2019-10-15: qty 2

## 2019-10-15 MED ORDER — REMIFENTANIL HCL 1 MG IV SOLR
INTRAVENOUS | Status: AC
  Filled 2019-10-15: qty 1000

## 2019-10-15 MED ORDER — KETAMINE HCL 50 MG/ML IJ SOLN
INTRAMUSCULAR | Status: AC
  Filled 2019-10-15: qty 10

## 2019-10-15 MED ORDER — ONDANSETRON HCL 4 MG/2ML IJ SOLN: 4.0000 mg | Freq: Once | INTRAMUSCULAR | Status: DC | PRN

## 2019-10-15 MED ORDER — SUCCINYLCHOLINE CHLORIDE 20 MG/ML IJ SOLN
INTRAMUSCULAR | Status: DC | PRN
  Administered 2019-10-15: 120 mg via INTRAVENOUS

## 2019-10-15 MED ORDER — MENTHOL 3 MG MT LOZG
1.0000 | LOZENGE | OROMUCOSAL | Status: DC | PRN
  Administered 2019-10-16: 3 mg via ORAL
  Filled 2019-10-15: qty 9

## 2019-10-15 MED ORDER — FENTANYL CITRATE (PF) 100 MCG/2ML IJ SOLN
INTRAMUSCULAR | Status: AC
  Filled 2019-10-15: qty 2

## 2019-10-15 MED ORDER — KETAMINE HCL 10 MG/ML IJ SOLN
INTRAMUSCULAR | Status: DC | PRN
  Administered 2019-10-15: 50 mg via INTRAVENOUS

## 2019-10-15 MED ORDER — METHOCARBAMOL 1000 MG/10ML IJ SOLN
500.0000 mg | Freq: Four times a day (QID) | INTRAVENOUS | Status: DC
  Administered 2019-10-15: 500 mg via INTRAVENOUS
  Filled 2019-10-15 (×4): qty 5

## 2019-10-15 MED ORDER — CEFAZOLIN SODIUM-DEXTROSE 2-4 GM/100ML-% IV SOLN
INTRAVENOUS | Status: AC
  Filled 2019-10-15: qty 100

## 2019-10-15 MED ORDER — ACETAMINOPHEN 10 MG/ML IV SOLN: 1000.0000 mg | Freq: Once | INTRAVENOUS | Status: DC | PRN

## 2019-10-15 MED ORDER — HYDROMORPHONE HCL 1 MG/ML IJ SOLN
INTRAMUSCULAR | Status: AC
  Administered 2019-10-15: 0.5 mg via INTRAVENOUS
  Filled 2019-10-15: qty 1

## 2019-10-15 MED ORDER — ROCURONIUM BROMIDE 10 MG/ML (PF) SYRINGE
PREFILLED_SYRINGE | INTRAVENOUS | Status: AC
  Filled 2019-10-15: qty 10

## 2019-10-15 MED ORDER — PHENOL 1.4 % MT LIQD: 1.0000 | OROMUCOSAL | Status: DC | PRN

## 2019-10-15 MED ORDER — FIBRIN SEALANT 2 ML SINGLE DOSE KIT
PACK | CUTANEOUS | Status: DC | PRN
  Administered 2019-10-15: 2 mL via TOPICAL

## 2019-10-15 MED ORDER — LIDOCAINE HCL (PF) 2 % IJ SOLN
INTRAMUSCULAR | Status: AC
  Filled 2019-10-15: qty 10

## 2019-10-15 MED ORDER — SODIUM CHLORIDE 0.9 % IV SOLN: INTRAVENOUS | Status: DC

## 2019-10-15 MED ORDER — CEFAZOLIN SODIUM-DEXTROSE 2-4 GM/100ML-% IV SOLN
2.0000 g | INTRAVENOUS | Status: AC
  Administered 2019-10-15: 2 g via INTRAVENOUS

## 2019-10-15 MED ORDER — ACETAMINOPHEN 650 MG RE SUPP: 650.0000 mg | RECTAL | Status: DC | PRN

## 2019-10-15 MED ORDER — FENTANYL CITRATE (PF) 100 MCG/2ML IJ SOLN
INTRAMUSCULAR | Status: DC | PRN
  Administered 2019-10-15: 50 ug via INTRAVENOUS
  Administered 2019-10-15 (×3): 25 ug via INTRAVENOUS
  Administered 2019-10-15: 50 ug via INTRAVENOUS
  Administered 2019-10-15: 25 ug via INTRAVENOUS

## 2019-10-15 MED ORDER — PHENYLEPHRINE HCL (PRESSORS) 10 MG/ML IV SOLN
INTRAVENOUS | Status: DC | PRN
  Administered 2019-10-15 (×3): 100 ug via INTRAVENOUS

## 2019-10-15 MED ORDER — PROPOFOL 10 MG/ML IV BOLUS
INTRAVENOUS | Status: DC | PRN
  Administered 2019-10-15: 30 mg via INTRAVENOUS
  Administered 2019-10-15: 170 mg via INTRAVENOUS
  Administered 2019-10-15: 50 mg via INTRAVENOUS

## 2019-10-15 MED ORDER — PHENYLEPHRINE HCL (PRESSORS) 10 MG/ML IV SOLN
INTRAVENOUS | Status: AC
  Filled 2019-10-15: qty 1

## 2019-10-15 MED ORDER — SODIUM CHLORIDE 0.9% FLUSH
3.0000 mL | Freq: Two times a day (BID) | INTRAVENOUS | Status: DC
  Administered 2019-10-15 – 2019-10-17 (×4): 3 mL via INTRAVENOUS

## 2019-10-15 MED ORDER — HYDROMORPHONE HCL 1 MG/ML IJ SOLN: 0.5000 mg | INTRAMUSCULAR | Status: DC | PRN

## 2019-10-15 MED ORDER — DEXAMETHASONE SODIUM PHOSPHATE 10 MG/ML IJ SOLN
INTRAMUSCULAR | Status: AC
  Filled 2019-10-15: qty 1

## 2019-10-15 MED ORDER — ONDANSETRON HCL 4 MG/2ML IJ SOLN
INTRAMUSCULAR | Status: DC | PRN
  Administered 2019-10-15: 4 mg via INTRAVENOUS

## 2019-10-15 MED ORDER — MAGNESIUM CITRATE PO SOLN: 1.0000 | Freq: Once | ORAL | Status: DC | PRN

## 2019-10-15 MED ORDER — SODIUM CHLORIDE 0.9 % IV SOLN
INTRAVENOUS | Status: DC | PRN
  Administered 2019-10-15: 20 ug/min via INTRAVENOUS

## 2019-10-15 MED ORDER — ONDANSETRON HCL 4 MG/2ML IJ SOLN
4.0000 mg | Freq: Four times a day (QID) | INTRAMUSCULAR | Status: DC | PRN
  Administered 2019-10-16: 4 mg via INTRAVENOUS
  Filled 2019-10-15: qty 2

## 2019-10-15 MED ORDER — THROMBIN 5000 UNITS EX SOLR
CUTANEOUS | Status: DC | PRN
  Administered 2019-10-15: 5000 [IU] via TOPICAL

## 2019-10-15 MED ORDER — PROPOFOL 500 MG/50ML IV EMUL
INTRAVENOUS | Status: DC | PRN
  Administered 2019-10-15: 125 ug/kg/min via INTRAVENOUS

## 2019-10-15 MED ORDER — OXYCODONE HCL 5 MG PO TABS: 5.0000 mg | ORAL_TABLET | ORAL | Status: DC | PRN

## 2019-10-15 MED ORDER — OXYCODONE HCL 5 MG PO TABS
10.0000 mg | ORAL_TABLET | ORAL | Status: DC | PRN
  Administered 2019-10-15 – 2019-10-17 (×7): 10 mg via ORAL
  Filled 2019-10-15 (×7): qty 2

## 2019-10-15 MED ORDER — POLYETHYLENE GLYCOL 3350 17 G PO PACK: 17.0000 g | PACK | Freq: Every day | ORAL | Status: DC | PRN

## 2019-10-15 MED ORDER — MIDAZOLAM HCL 2 MG/2ML IJ SOLN
INTRAMUSCULAR | Status: DC | PRN
  Administered 2019-10-15: 2 mg via INTRAVENOUS

## 2019-10-15 MED ORDER — OXYCODONE HCL 5 MG PO TABS: 5.0000 mg | ORAL_TABLET | Freq: Once | ORAL | Status: DC | PRN

## 2019-10-15 MED ORDER — DEXAMETHASONE SODIUM PHOSPHATE 10 MG/ML IJ SOLN
INTRAMUSCULAR | Status: DC | PRN
  Administered 2019-10-15: 10 mg via INTRAVENOUS

## 2019-10-15 MED ORDER — SODIUM CHLORIDE 0.9% FLUSH: 3.0000 mL | INTRAVENOUS | Status: DC | PRN

## 2019-10-15 MED ORDER — ACETAMINOPHEN 10 MG/ML IV SOLN
INTRAVENOUS | Status: DC | PRN
  Administered 2019-10-15: 1000 mg via INTRAVENOUS

## 2019-10-15 MED ORDER — SENNA 8.6 MG PO TABS
1.0000 | ORAL_TABLET | Freq: Two times a day (BID) | ORAL | Status: DC
  Administered 2019-10-15 – 2019-10-17 (×4): 8.6 mg via ORAL
  Filled 2019-10-15 (×4): qty 1

## 2019-10-15 MED ORDER — LIDOCAINE HCL (CARDIAC) PF 100 MG/5ML IV SOSY
PREFILLED_SYRINGE | INTRAVENOUS | Status: DC | PRN
  Administered 2019-10-15: 100 mg via INTRAVENOUS

## 2019-10-15 MED ORDER — SODIUM CHLORIDE 0.9 % IR SOLN
Status: DC | PRN
  Administered 2019-10-15: 1000 mL

## 2019-10-15 MED ORDER — CELECOXIB 100 MG PO CAPS
100.0000 mg | ORAL_CAPSULE | Freq: Two times a day (BID) | ORAL | Status: DC
  Administered 2019-10-15 – 2019-10-17 (×4): 100 mg via ORAL
  Filled 2019-10-15 (×5): qty 1

## 2019-10-15 MED ORDER — SEVOFLURANE IN SOLN
RESPIRATORY_TRACT | Status: AC
  Filled 2019-10-15: qty 250

## 2019-10-15 MED ORDER — BISACODYL 5 MG PO TBEC: 5.0000 mg | DELAYED_RELEASE_TABLET | Freq: Every day | ORAL | Status: DC | PRN

## 2019-10-15 MED ORDER — SODIUM CHLORIDE 0.9 % IV SOLN: 250.0000 mL | INTRAVENOUS | Status: DC

## 2019-10-15 MED ORDER — SUCCINYLCHOLINE CHLORIDE 200 MG/10ML IV SOSY
PREFILLED_SYRINGE | INTRAVENOUS | Status: AC
  Filled 2019-10-15: qty 10

## 2019-10-15 MED ORDER — ONDANSETRON HCL 4 MG PO TABS
4.0000 mg | ORAL_TABLET | Freq: Four times a day (QID) | ORAL | Status: DC | PRN
  Administered 2019-10-17: 4 mg via ORAL
  Filled 2019-10-15: qty 1

## 2019-10-15 MED ORDER — ACETAMINOPHEN 325 MG PO TABS
650.0000 mg | ORAL_TABLET | ORAL | Status: DC | PRN
  Administered 2019-10-17: 650 mg via ORAL
  Filled 2019-10-15: qty 2

## 2019-10-15 MED ORDER — REMIFENTANIL HCL 1 MG IV SOLR
INTRAVENOUS | Status: DC | PRN
  Administered 2019-10-15: .05 ug/kg/min via INTRAVENOUS

## 2019-10-15 MED ORDER — LACTATED RINGERS IV SOLN: INTRAVENOUS | Status: DC | PRN

## 2019-10-15 SURGICAL SUPPLY — 72 items
ADH SKN CLS APL DERMABOND .7 (GAUZE/BANDAGES/DRESSINGS) ×1
AGENT HMST MTR 8 SURGIFLO (HEMOSTASIS) ×1
APL PRP STRL LF DISP 70% ISPRP (MISCELLANEOUS) ×1
BAND RUBBER 3X1/6 TAN STRL (MISCELLANEOUS) IMPLANT
BASKET BONE COLLECTION (BASKET) ×2 IMPLANT
BLADE BOVIE TIP EXT 4 (BLADE) ×2 IMPLANT
BLADE SURG 15 STRL LF DISP TIS (BLADE) IMPLANT
BLADE SURG 15 STRL SS (BLADE)
BOWL CEMENT MIX W SPATULA BONE (MISCELLANEOUS) ×2 IMPLANT
BULB RESERV EVAC DRAIN JP 100C (MISCELLANEOUS) IMPLANT
BUR NEURO DRILL SOFT 3.0X3.8M (BURR) ×2 IMPLANT
CANISTER SUCT 1200ML W/VALVE (MISCELLANEOUS) ×4 IMPLANT
CHLORAPREP W/TINT 26 (MISCELLANEOUS) ×2 IMPLANT
COUNTER NEEDLE 20/40 LG (NEEDLE) ×2 IMPLANT
COVER BACK TABLE REUSABLE LG (DRAPES) IMPLANT
COVER LIGHT HANDLE STERIS (MISCELLANEOUS) ×4 IMPLANT
COVER WAND RF STERILE (DRAPES) ×2 IMPLANT
CRADLE LAMINECT ARM (MISCELLANEOUS) ×2 IMPLANT
CUP MEDICINE 2OZ PLAST GRAD ST (MISCELLANEOUS) ×2 IMPLANT
DERMABOND ADVANCED (GAUZE/BANDAGES/DRESSINGS) ×1
DERMABOND ADVANCED .7 DNX12 (GAUZE/BANDAGES/DRESSINGS) ×1 IMPLANT
DRAIN CHANNEL JP 10F RND 20C F (MISCELLANEOUS) IMPLANT
DRAPE 3/4 80X56 (DRAPES) IMPLANT
DRAPE C-ARM 42X72 X-RAY (DRAPES) ×4 IMPLANT
DRAPE INCISE IOBAN 66X45 STRL (DRAPES) IMPLANT
DRAPE LAPAROTOMY 77X122 PED (DRAPES) ×2 IMPLANT
DRAPE MICROSCOPE SPINE 48X150 (DRAPES) ×2 IMPLANT
DRAPE POUCH INSTRU U-SHP 10X18 (DRAPES) IMPLANT
DRAPE SURG 17X11 SM STRL (DRAPES) ×8 IMPLANT
ELECT CAUTERY BLADE TIP 2.5 (TIP) ×2
ELECTRODE CAUTERY BLDE TIP 2.5 (TIP) ×1 IMPLANT
FEE INTRAOP MONITOR IMPULS NCS (MISCELLANEOUS) ×1 IMPLANT
FRAME EYE SHIELD (PROTECTIVE WEAR) ×2 IMPLANT
GAUZE SPONGE 4X4 12PLY STRL (GAUZE/BANDAGES/DRESSINGS) ×2 IMPLANT
GLOVE SURG SYN 7.0 (GLOVE) ×4 IMPLANT
GLOVE SURG SYN 8.5  E (GLOVE) ×6
GLOVE SURG SYN 8.5 E (GLOVE) ×3 IMPLANT
GOWN SRG XL LVL 3 NONREINFORCE (GOWNS) ×1 IMPLANT
GOWN STRL NON-REIN TWL XL LVL3 (GOWNS) ×2
GOWN STRL REUS W/ TWL LRG LVL3 (GOWN DISPOSABLE) ×1 IMPLANT
GOWN STRL REUS W/TWL LRG LVL3 (GOWN DISPOSABLE) ×2
GRADUATE 1200CC STRL 31836 (MISCELLANEOUS) ×2 IMPLANT
GRAFT DURAGEN MATRIX 1WX1L (Tissue) ×2 IMPLANT
HEMOSTAT SURGICEL 2X3 (HEMOSTASIS) ×2 IMPLANT
INTRAOP MONITOR FEE IMPULS NCS (MISCELLANEOUS) ×1
INTRAOP MONITOR FEE IMPULSE (MISCELLANEOUS) ×2
IV CATH ANGIO 12GX3 LT BLUE (NEEDLE) ×2 IMPLANT
KIT TURNOVER KIT A (KITS) ×2 IMPLANT
MARKER SKIN DUAL TIP RULER LAB (MISCELLANEOUS) ×4 IMPLANT
NEEDLE HYPO 22GX1.5 SAFETY (NEEDLE) ×2 IMPLANT
NS IRRIG 1000ML POUR BTL (IV SOLUTION) ×2 IMPLANT
PACK LAMINECTOMY NEURO (CUSTOM PROCEDURE TRAY) ×2 IMPLANT
PIN CASPAR 14 (PIN) ×1 IMPLANT
PIN CASPAR 14MM (PIN) ×2
PLATE ANT CERV XTEND 3 LV 45 (Plate) ×2 IMPLANT
SCREW VAR 4.2 XD SELF DRILL 16 (Screw) ×12 IMPLANT
SPACER C HEDRON 12X14 7M 7D (Spacer) ×2 IMPLANT
SPACER CERV TI 12X14X12 (Spacer) ×2 IMPLANT
SPACER CERV TI 12X14X12 0D (Spacer) ×2 IMPLANT
SPACER CERV TI FORT 18-23X12 (Spacer) ×2 IMPLANT
SPOGE SURGIFLO 8M (HEMOSTASIS) ×2
SPONGE KITTNER 5P (MISCELLANEOUS) ×2 IMPLANT
SPONGE SURGIFLO 8M (HEMOSTASIS) ×1 IMPLANT
STAPLER SKIN PROX 35W (STAPLE) IMPLANT
STRIP CLOSURE SKIN 1/2X4 (GAUZE/BANDAGES/DRESSINGS) IMPLANT
SUT V-LOC 90 ABS DVC 3-0 CL (SUTURE) ×2 IMPLANT
SUT VIC AB 3-0 SH 8-18 (SUTURE) ×2 IMPLANT
SYR 30ML LL (SYRINGE) ×2 IMPLANT
TAPE CLOTH 3X10 WHT NS LF (GAUZE/BANDAGES/DRESSINGS) ×2 IMPLANT
TOWEL OR 17X26 4PK STRL BLUE (TOWEL DISPOSABLE) ×8 IMPLANT
TRAY FOLEY MTR SLVR 16FR STAT (SET/KITS/TRAYS/PACK) ×2 IMPLANT
TUBING CONNECTING 10 (TUBING) ×2 IMPLANT

## 2019-10-15 NOTE — Anesthesia Preprocedure Evaluation (Signed)
Anesthesia Evaluation  Patient identified by MRN, date of birth, ID band Patient awake    Reviewed: Allergy & Precautions, NPO status , Patient's Chart, lab work & pertinent test results  History of Anesthesia Complications Negative for: history of anesthetic complications  Airway Mallampati: III  TM Distance: >3 FB Neck ROM: Full    Dental no notable dental hx. (+) Teeth Intact, Dental Advisory Given   Pulmonary sleep apnea , neg COPD, Patient abstained from smoking.Not current smoker,  Possible OSA - patient says she has been observed to have apneic episodes, has not had a formal sleep study yet   Pulmonary exam normal breath sounds clear to auscultation       Cardiovascular Exercise Tolerance: Good METS(-) hypertension(-) CAD and (-) Past MI negative cardio ROS  (-) dysrhythmias  Rhythm:Regular Rate:Normal - Systolic murmurs    Neuro/Psych negative neurological ROS  negative psych ROS   GI/Hepatic neg GERD  ,(+)     (-) substance abuse  ,   Endo/Other  neg diabetes  Renal/GU negative Renal ROS     Musculoskeletal  (+) Arthritis ,   Abdominal   Peds  Hematology   Anesthesia Other Findings Past Medical History: No date: Allergy No date: Mixed hyperlipidemia No date: Nut allergy     Comment:  [place] nut  Reproductive/Obstetrics                             Anesthesia Physical Anesthesia Plan  ASA: II  Anesthesia Plan: General   Post-op Pain Management:    Induction: Intravenous  PONV Risk Score and Plan: 4 or greater and Ondansetron, Dexamethasone, Propofol infusion, TIVA and Midazolam  Airway Management Planned: Oral ETT and Video Laryngoscope Planned  Additional Equipment: Arterial line  Intra-op Plan:   Post-operative Plan: Extubation in OR  Informed Consent: I have reviewed the patients History and Physical, chart, labs and discussed the procedure including the  risks, benefits and alternatives for the proposed anesthesia with the patient or authorized representative who has indicated his/her understanding and acceptance.     Dental advisory given  Plan Discussed with: CRNA and Surgeon  Anesthesia Plan Comments: (Discussed risks of anesthesia with patient, including PONV, sore throat, lip/dental damage. Rare risks discussed as well, such as cardiorespiratory and neurological sequelae. Patient understands. Discussed possible arterial line as well.)        Anesthesia Quick Evaluation

## 2019-10-15 NOTE — Progress Notes (Signed)
Hand grips good  Foot pumps good  Moving all extremities    A lot of secretions

## 2019-10-15 NOTE — Op Note (Signed)
Indications: Ms. Casola is a 54 yo female with cervical myelopathy.  She presented with worsening symptoms and was advised for surgery.  Findings: OPLL  Preoperative Diagnosis: Cervical myelopathy Postoperative Diagnosis: same   EBL: 400 ml IVF: 1600 ml Drains: none Disposition: Extubated and Stable to PACU Complications: none  A foley catheter was placed.   Preoperative Note:   Risks of surgery discussed include: infection, bleeding, stroke, coma, death, paralysis, CSF leak, nerve/spinal cord injury, numbness, tingling, weakness, complex regional pain syndrome, recurrent stenosis and/or disc herniation, vascular injury, development of instability, neck/back pain, need for further surgery, persistent symptoms, development of deformity, and the risks of anesthesia. The patient understood these risks and agreed to proceed.  Operative Note:  PROCEDURES: 1. Anterior cervical corpectomy of C5, rmoving greater than 50% of the vertebral body at that level. 2. Anterior Arthrodesis from C4 to C7 3. Anterior cervical spine instrumentation C4 -C7 4. Insertion of biomechanic device (Globus Fortify) as a vertebral body replacement at C5 5. Harvesting of autograft via the same incision 6. Use of the operative microscope for aid in microdissection 7. Anterior cervical discectomy and fusion C6-7 with use of biomechanical device  PROCEDURE IN DETAIL: After obtaining informed consent, the patient taken to the operating room, placed in supine position, general anesthesia induced.  The patient had a small shoulder roll placed behind their shoulders.  After a timeout, the patient received preop antibiotics and 10 mg of IV Decadron.  The patient had a neck incision outlined, was prepped and draped in usual sterile fashion. The incision was injected with local anesthetic.    An incision was opened, dissection taken down medial to the carotid artery and jugular vein, lateral to the trachea and esophagus.   The prevertebral fascia identified and a localizing x-ray demonstrated the correct level.  The disc was marked with the Bovie. The soft tissues and longus colli were dissected laterally from the vertebral bodies from C4 to C7 using monopolar electrocautery. Blackbelt retractors were then placed. The microscope was brought into the field for aid in microdissection.   With this complete, distractor pins were placed in the vertebral bodies of C4 and C6. The distractor was placed and the disc spaces gently distracted. The annulus at C4/5 was opened using a Bovie. Using curettes and a pituitary rongeur, the disc was removed from the endplate. Using a nerve hook, the posterior longitudinal ligament was elevated and divided. Using curettes and a 56mm Kerrison punch, the posterior longitudinal ligament was removed and the spinal cord and neuroforamina decompressed. The nerve hook was used to confirm decompression.   Then, annulus at C5/6 was opened using a Bovie, cutting away from the esophagus. Using curettes and a pituitary rongeur, the disc was removed from the endplate. Using a nerve hook, the posterior longitudinal ligament was elevated and divided. Using curettes and a 54mm Kerrison punch, the posterior longitudinal ligament was removed and the spinal cord and neuroforamina decompressed. The nerve hook was used to confirm decompression.   After disc preparation, a portion of the C5 vertebral body was removed with a Leksell rongeur and saved for used as autograft. The high speed drill was then used to finish the corpectomy. At least 70% of the vertebral body was removed in total. The site was measured to confirm adequate width. The posterior longitudinal ligament was then elevated from the inferior disc space and divided superiorly until the vertebral body wall was removed. Ossification of the posterior longitudinal ligament was noted, and incorporation of  the PLL into the dura was apparent at one site behind C5.   During removal, a small dural defect was noted. This occurred during the normal course of performing the corpectomy. The nerve hook was then used to confirm decompression of the spinal cord. The endplates were gently decorticated for arthrodesis preparation.   A piece of duragen was onlaid on the dural defect. After decompression, the corpectomy defect was measured and a size 19-23 mm Globus Fortify vertebral body replacement was chosen. It was filled with autograft, and then placed used flouroscopic guidance.   We then removed the C4 pin and placed bone wax for hemostasis.  The pin was moved to C7.  The distractor was placed, and the anulus at C6/7 was opened using a bovie.  Curettes and pituitary rongeurs used to remove the majority of disk, then the drill was used to remove the posterior osteophyte and begin the foraminotomies. The nerve hook was used to elevate the posterior longitudinal ligament, which was then removed with Kerrison rongeurs. The microblunt nerve hook could be passed out the foramen bilateral at each level.   Meticulous hemostasis was obtained.  A biomechanical device (Globus Hedron 7 mm height x 16 mm width by 14 mm depth) was placed at C6/7. The device had been filled with autograft for aid in arthrodesis.  The caspar distractor was removed, and bone wax used for hemostasis. A 45 mm Globus Xtend plate was chosen.  Two screws placed in each vertebral body, respectively making sure the screws were behind the locking mechanism.  Final AP and lateral radiographs were taken.   Electrophysiologic monitoring was stable throughout.  I performed the entire procedure with Marin Olp PA as an Environmental consultant.  Meade Maw MD
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
## 2019-10-15 NOTE — Progress Notes (Signed)
Hand grips equal but mild  Having a lot of secretions   Foot pumps present but weak at present  Pt sleepy

## 2019-10-15 NOTE — Transfer of Care (Signed)
Immediate Anesthesia Transfer of Care Note  Patient: Beverly Hughes  Procedure(s) Performed: ANTERIOR CERVICAL CORPECTOMY C5 (N/A ) C6-7 ANTERIOR CERVICAL DISCECTOMY AND FUSION, C4-7 ANTERIOR SPINAL INSTRUMENTATION (N/A )  Patient Location: PACU  Anesthesia Type:General  Level of Consciousness: awake and drowsy  Airway & Oxygen Therapy: Patient Spontanous Breathing and Patient connected to face mask oxygen  Post-op Assessment: Report given to RN and Post -op Vital signs reviewed and stable  Post vital signs: Reviewed and stable  Last Vitals:  Vitals Value Taken Time  BP 147/96 10/15/19 1130  Temp 36.4 C 10/15/19 1130  Pulse 118 10/15/19 1135  Resp 11 10/15/19 1134  SpO2 99 % 10/15/19 1135  Vitals shown include unvalidated device data.  Last Pain:  Vitals:   10/15/19 0619  TempSrc: Oral  PainSc: 0-No pain         Complications: No apparent anesthesia complications

## 2019-10-15 NOTE — Progress Notes (Signed)
Pt noncompliant with post-op instructions given by RN. Pt found sitting on side of bed without cervical collar. Instructed pt to stay where she was and wait for RN to assist back in bed. Pt stated that she had a headache and needed to sit up.   IS education not completed, pt does not have Cervical collar available at this time.

## 2019-10-15 NOTE — Anesthesia Procedure Notes (Signed)
Procedure Name: Intubation Date/Time: 10/15/2019 7:43 AM Performed by: Allean Found, CRNA Pre-anesthesia Checklist: Patient identified, Patient being monitored, Timeout performed, Emergency Drugs available and Suction available Patient Re-evaluated:Patient Re-evaluated prior to induction Oxygen Delivery Method: Circle system utilized Preoxygenation: Pre-oxygenation with 100% oxygen Induction Type: IV induction Ventilation: Mask ventilation without difficulty Laryngoscope Size: 3 and McGraph Grade View: Grade I Tube type: Oral Tube size: 7.0 mm Number of attempts: 1 Airway Equipment and Method: Stylet Placement Confirmation: ETT inserted through vocal cords under direct vision,  positive ETCO2 and breath sounds checked- equal and bilateral Secured at: 21 cm Tube secured with: Tape Dental Injury: Teeth and Oropharynx as per pre-operative assessment

## 2019-10-15 NOTE — Progress Notes (Signed)
Orthopedic Tech Progress Note Patient Details:  Beverly Hughes 09/22/1965 [id number] Called in order to [employer] for a COLLAR. Patient ID: Beverly Hughes, female   DOB: [birth year], 54 y.o.   MRN: [id number]   Janit Pagan 10/15/2019, 4:15 PM

## 2019-10-15 NOTE — Anesthesia Postprocedure Evaluation (Signed)
Anesthesia Post Note  Patient: Beverly Hughes  Procedure(s) Performed: ANTERIOR CERVICAL CORPECTOMY C5 (N/A ) C6-7 ANTERIOR CERVICAL DISCECTOMY AND FUSION, C4-7 ANTERIOR SPINAL INSTRUMENTATION (N/A )  Patient location during evaluation: PACU Anesthesia Type: General Level of consciousness: awake and alert Pain management: pain level controlled Vital Signs Assessment: post-procedure vital signs reviewed and stable Respiratory status: spontaneous breathing, nonlabored ventilation, respiratory function stable and patient connected to nasal cannula oxygen Cardiovascular status: blood pressure returned to baseline and stable Postop Assessment: no apparent nausea or vomiting Anesthetic complications: no     Last Vitals:  Vitals:   10/15/19 1209 10/15/19 1218  BP: (!) 142/88   Pulse: 100 (!) 109  Resp: 12 16  Temp:    SpO2: 97% 97%    Last Pain:  Vitals:   10/15/19 1218  TempSrc:   PainSc: 8                  Arita Miss

## 2019-10-15 NOTE — Progress Notes (Signed)
Procedure: C5 corpectomy with C6-7 ACDF Procedure date: 10/15/2019 Diagnosis: Cervical Myelopathy   History: Beverly Hughes is s/p C5 corpectomy with C6-7 ACDF POD0: Tolerated procedure well. Evaluated in post op recovery still disoriented from anesthesia but able to answer questions and obey commands.   Physical Exam: Vitals:   10/15/19 0619  BP: (!) 158/96  Pulse: (!) 116  Resp: 20  Temp: 98 F (36.7 C)  SpO2: 99%   Strength: Unable to accurately assess at this time.  Able to move all extremities independently Sensation: Unable to accurately assess at this time. Skin: Glue intact at incision site.  Data:  Recent Labs  Lab 10/13/19 1246  NA 139  K 3.7  CL 103  CO2 27  BUN 13  CREATININE 0.60  GLUCOSE 94  CALCIUM 9.4   No results for input(s): AST, ALT, ALKPHOS in the last 168 hours.  Invalid input(s): TBILI   Recent Labs  Lab 10/13/19 1246  WBC 6.8  HGB 13.2  HCT 39.6  PLT 269   Recent Labs  Lab 10/13/19 1246  APTT 28  INR 1.0         Other tests/results: Cervical xrays pending  Assessment/Plan:  Beverly Hughes is POD0 s/p C5 corpectomy with C6-7 ACDF. Will continue to monitor  - monitor drain output - mobilize - pain control - DVT prophylaxis - PTOT - Brace - Imaging -Catheter care  Marin Olp PA-C Department of Neurosurgery

## 2019-10-15 NOTE — Progress Notes (Addendum)
D: Pt alert and oriented x 4. Pt reports experiencing 8/10 surgical pain, PRN given. Pt reports no pain relief, however falls asleep during initial assessment and navigation questions. Pt this evening is continuing to be noncompliant with post-op instructions given multiple times. Pt was instructed they were not to be out of bed w/o cervical brace and was found by RT sitting on side of bed. Pt stated she had a headache and now was unable to get back into bed w/o assistance. Pt was afraid she would strain/cause harm to surgical area. RT contacted/notified this Probation officer of pt's actions. This Probation officer w/NT was able to get pt back into the bed. Pt educated again on post-op instructions.   Pt's mother contacted this Probation officer w/question on post op instructions and why pt could not get out of bed, when pt would get brace, when PT would work with pt. Pt's mother was also informed of post op instructions, pain med policies, and that pt's brace was in route of being delivered.  A: Scheduled medications administered to pt, per MD orders. Support and encouragement provided. Frequent verbal contact made.    R: No adverse drug reactions noted. Pt complaint with medications and non-complaint at this time with txment plan. Pt is stable at this time, Will continue to monitor and provide care for as ordered.  Pt vomited at end of shift. Emesis was yellow/green in color. Pt has had a productive cough post-op, small thick, green in appearance. Pt states she feels better now that she's gotten sick.

## 2019-10-16 ENCOUNTER — Inpatient Hospital Stay: Payer: 59

## 2019-10-16 NOTE — Evaluation (Signed)
Occupational Therapy Evaluation Patient Details Name: Beverly Hughes MRN: [id number] DOB: 1965-12-02 Today's Date: 10/16/2019    History of Present Illness Rain Dicke is a 54 yo with PMH of degenerative disc disease and cervical myelopathy.  She presented with worsening symptoms and was advised for surgery. Pt is now s/p anterior cervical corpectomy of C5, anterior arthrodesis from C4 to C7, anterior cervical discectomy and fusion C6-7 .   Clinical Impression   Ms. Withington was seen for OT evaluation this date, POD#1 from anterior cervical corpectomy of C5, anterior arthrodesis from C4 to C7, & anterior cervical discectomy and fusion C6-7. Prior to hospital admission, pt was independent with mobility, ADL, and IADL. No falls in past 6 months. Pt lives with her adult son in an apartment home with a full flight of steps to enter and bilateral hand rails. Pt works for a [employer] and plans to take 6 weeks off work to recover. Pt endorses her son, parents and sister plan to be available to provide 24/7 assist/support as needed for pt. Currently pt requires supervision for safety during functional mobility as well as minimal assistance for BADL management. She is received this am wearing her aspen collar and wears her cervical brace throughout the OT evaluation. Pt educated in cervical precautions, Aspen collar management, self care skills, AE, and home/routines modifications to maximize safety and functional independence while minimizing falls risk and maintaining precautions. Handout provided to support recall and carry over of learned precautions/techniques for bed mobility, functional transfers, and self care skills. Pt would benefit from skilled OT to address noted impairments and functional limitations (see below for any additional details) in order to maximize safety and independence while minimizing falls risk and caregiver burden. Upon hospital discharge, recommend HHOT to  maximize pt safety and return to functional independence during meaningful occupations of daily life.      Follow Up Recommendations  Home health OT;Supervision - Intermittent    Equipment Recommendations  3 in 1 bedside commode    Recommendations for Other Services       Precautions / Restrictions Precautions Precautions: Cervical;Fall Precaution Booklet Issued: Yes (comment) Precaution Comments: Moderate fall; no bending, arching, twisting. Required Braces or Orthoses: Cervical Brace Cervical Brace: Hard collar;Other (comment)(Don while sitting, may have off whie in bed, to ambulate to bathroom and to shower.) Restrictions Weight Bearing Restrictions: No      Mobility Bed Mobility Overal bed mobility: Needs Assistance Bed Mobility: Supine to Sit     Supine to sit: Supervision;HOB elevated     General bed mobility comments: Pt requires cueing for sequencing and adherence to precautions  Transfers Overall transfer level: Needs assistance Equipment used: Rolling walker (2 wheeled) Transfers: Sit to/from Stand Sit to Stand: Supervision         General transfer comment: Pt educated on safe transfer technique when using RW to perform STS    Balance Overall balance assessment: Needs assistance Sitting-balance support: Feet supported;No upper extremity supported Sitting balance-Leahy Scale: Fair Sitting balance - Comments: steady static sitting and reaching within BOS this date.   Standing balance support: During functional activity;Bilateral upper extremity supported Standing balance-Leahy Scale: Fair Standing balance comment: Pt with moderate BUE support on RW during fxl mobility this date.                           ADL either performed or assessed with clinical judgement   ADL Overall ADL's : Needs  assistance/impaired                                       General ADL Comments: Pt is functionally limited by increased pain,  generalized weakness in BUE as well as cervical precautions this date. She requires minimal assistance to perform ADL management including bathing and dressing in a seated position with consistent prompting for cervical precautions. Supervision for safety during functional mobility. ADL assessment limited by pt feeling "woozy" during session. Pt would benefit from further review of compensatory bathing, dressing, and grooming strategies in consideration of cervical precautions.     Vision Baseline Vision/History: Wears glasses Wears Glasses: Reading only Patient Visual Report: No change from baseline       Perception     Praxis      Pertinent Vitals/Pain Pain Assessment: 0-10 Pain Score: 8  Pain Descriptors / Indicators: Grimacing;Guarding;Sore;Radiating;Pressure Pain Intervention(s): Limited activity within patient's tolerance;Monitored during session;RN gave pain meds during session;Repositioned;Utilized relaxation techniques     Hand Dominance Right   Extremity/Trunk Assessment Upper Extremity Assessment Upper Extremity Assessment: Generalized weakness(BUE grossly 4/5 t/o. Pt endorses continues weakness in her RUE this date.)   Lower Extremity Assessment Lower Extremity Assessment: Overall WFL for tasks assessed;Defer to PT evaluation   Cervical / Trunk Assessment Cervical / Trunk Assessment: Normal   Communication Communication Communication: No difficulties   Cognition Arousal/Alertness: Lethargic;Suspect due to medications Behavior During Therapy: [facility] for tasks assessed/performed Overall Cognitive Status: Within Functional Limits for tasks assessed                                     General Comments       Exercises Other Exercises Other Exercises: Pt educated on falls prevention strategies, safe use of AE for fxl mobility, cervical precautions (handout provided), and compensatory strategies for ADL management in consideration of cervical precautions  this date. Would benefit from review of techniques/opportunity to implement during ADL tasks.   Shoulder Instructions      Home Living Family/patient expects to be discharged to:: Private residence Living Arrangements: Children(Adult son) Available Help at Discharge: Family;Friend(s);Available 24 hours/day Type of Home: Apartment Home Access: Stairs to enter [employer] of Steps: flight Entrance Stairs-Rails: Right;Left Home Layout: One level     Bathroom Shower/Tub: Walk-in shower;Door   Bathroom Toilet: Handicapped height     Home Equipment: Hand held shower head;Adaptive equipment Adaptive Equipment: Long-handled sponge        Prior Functioning/Environment Level of Independence: Independent        Comments: Pt endorses she was independent with functional mobility without an AD. She denies falls history in the past 6 months.        OT Problem List: Decreased strength;Decreased coordination;Pain;Decreased activity tolerance;Decreased safety awareness;Decreased range of motion;Impaired balance (sitting and/or standing);Decreased knowledge of use of DME or AE;Decreased knowledge of precautions      OT Treatment/Interventions: Self-care/ADL training;Therapeutic exercise;Therapeutic activities;DME and/or AE instruction;Patient/family education;Balance training    OT Goals(Current goals can be found in the care plan section) Acute Rehab OT Goals Patient Stated Goal: To have less pain OT Goal Formulation: With patient Time For Goal Achievement: 10/30/19 Potential to Achieve Goals: Good ADL Goals Pt Will Perform Grooming: with modified independence;with adaptive equipment;sitting(With LRAD PRN for safety) Pt Will Perform Upper Body Dressing: sitting;with modified independence(With LRAD PRN  for safety) Pt Will Perform Lower Body Dressing: sit to/from stand;with modified independence;with adaptive equipment(With LRAD PRN for safety)  OT Frequency: Min 2X/week    Barriers to D/C: Inaccessible home environment  Pt with 1 flight of stairs to enter the home.       Co-evaluation              AM-PAC OT "6 Clicks" Daily Activity     Outcome Measure Help from another person eating meals?: A Little Help from another person taking care of personal grooming?: A Little Help from another person toileting, which includes using toliet, bedpan, or urinal?: A Little Help from another person bathing (including washing, rinsing, drying)?: A Little Help from another person to put on and taking off regular upper body clothing?: A Little Help from another person to put on and taking off regular lower body clothing?: A Little 6 Click Score: 18   End of Session Equipment Utilized During Treatment: Gait belt;Rolling walker Nurse Communication: Mobility status  Activity Tolerance: Patient tolerated treatment well Patient left: in chair;with call bell/phone within reach;with chair alarm set;with nursing/sitter in room;Other (comment)(With nursing students in room for AM meds/assessment.)  OT Visit Diagnosis: Other abnormalities of gait and mobility (R26.89);Muscle weakness (generalized) (M62.81)                Time: [id number] OT Time Calculation (min): 51 min Charges:  OT General Charges $OT Visit: 1 Visit OT Evaluation $OT Eval Moderate Complexity: 1 Mod OT Treatments $Self Care/Home Management : 38-52 mins  Shara Blazing, M.S., OTR/L Ascom: [phone] 10/16/19, 10:59 AM

## 2019-10-16 NOTE — Evaluation (Signed)
Physical Therapy Evaluation Patient Details Name: Beverly Hughes MRN: [id number] DOB: May 13, 1966 Today's Date: 10/16/2019   History of Present Illness  Beverly Hughes is a 54 yo with PMH of degenerative disc disease and cervical myelopathy.  She presented with worsening symptoms and was advised for surgery. Pt is now s/p anterior cervical corpectomy of C5, anterior arthrodesis from C4 to C7, anterior cervical discectomy and fusion C6-7  Clinical Impression  Pt is a 54 yo female admitted for above. Pt received sitting up in the bed due to the recliner being too uncomfortable. pt educated on importance of sitting upright for prevention of secondary complications. Pt previously independent and has support and assistance from friends and family. Pt has one flight of stairs to get into apartment. Pt reporting feeling woozy and sick during session, suspect due to medications and pt not yet having breakfast, limiting session. Pt currently requiring supervision to min guard assist for functional mobility. Pt ambulated to door of room and to restroom with RW for improved stability. Pt ambulates with guarded and cautious gait pattern secondary to pain and feeling sick. No buckling or unsteadiness noted. Pt with good carryover of mobility within precautions from working with OT this morning. Pt able to maintain static standing balance at sink for hand washing but benefits from BUE support from RW for ambulation and dynamic balance. Pt wearing hard cervical collar t/o session and VSS.  Pt presents with pain, decreased strength and balance consistent with recent surgery and will benefit from further acute PT to continue progressing mobility and maximize return to PLOF. Recommendation for HHPT following hospital discharge.    Follow Up Recommendations Home health PT    Equipment Recommendations  3in1 (PT);Rolling walker with 5" wheels    Recommendations for Other Services       Precautions / Restrictions  Precautions Precautions: Cervical;Fall Precaution Booklet Issued: Yes (comment) Precaution Comments: Moderate fall; no bending, arching, twisting. Required Braces or Orthoses: Cervical Brace Cervical Brace: Hard collar Restrictions Weight Bearing Restrictions: No      Mobility  Bed Mobility Overal bed mobility: Needs Assistance Bed Mobility: Supine to Sit;Sit to Supine     Supine to sit: Supervision;HOB elevated Sit to supine: Supervision;HOB elevated   General bed mobility comments: supervision for safety, increased time to get EOB  Transfers Overall transfer level: Needs assistance Equipment used: Rolling walker (2 wheeled) Transfers: Sit to/from Stand Sit to Stand: Supervision         General transfer comment: supervision for safety, good carryover of cuing from OT this morning with safe transfers  Ambulation/Gait Ambulation/Gait assistance: Min guard Gait Distance (Feet): 20 Feet Assistive device: Rolling walker (2 wheeled) Gait Pattern/deviations: Step-through pattern;Decreased stride length;Antalgic Gait velocity: dec   General Gait Details: pt ambulated to door in room and then to restroom with RW for extra stability, light use of RW, very guarded and cautious  Stairs            Wheelchair Mobility    Modified Rankin (Stroke Patients Only)       Balance Overall balance assessment: Needs assistance Sitting-balance support: Feet supported;No upper extremity supported Sitting balance-Leahy Scale: Fair Sitting balance - Comments: steady static sitting and reaching within BOS this date.   Standing balance support: During functional activity;Bilateral upper extremity supported Standing balance-Leahy Scale: Fair Standing balance comment: able to maintian static standing at sink for hand washing, benefits from BUE during ambulation  Pertinent Vitals/Pain Pain Assessment: 0-10 Pain Score: 7  Pain Descriptors /  Indicators: Grimacing;Guarding;Sore;Radiating;Pressure Pain Intervention(s): Limited activity within patient's tolerance;Monitored during session;Repositioned    Home Living Family/patient expects to be discharged to:: Private residence Living Arrangements: Children Available Help at Discharge: Family;Friend(s);Available 24 hours/day Type of Home: Apartment Home Access: Stairs to enter Entrance Stairs-Rails: Right;Left Entrance Stairs-Number of Steps: flight Home Layout: One level Home Equipment: Hand held shower head;Adaptive equipment      Prior Function Level of Independence: Independent         Comments: Pt endorses she was independent with functional mobility without an AD. She denies falls history in the past 6 months.     Hand Dominance   Dominant Hand: Right    Extremity/Trunk Assessment   Upper Extremity Assessment Upper Extremity Assessment: Defer to OT evaluation    Lower Extremity Assessment Lower Extremity Assessment: Overall WFL for tasks assessed(BLE grossly 4-/5)    Cervical / Trunk Assessment Cervical / Trunk Assessment: Normal  Communication   Communication: No difficulties  Cognition Arousal/Alertness: Lethargic Behavior During Therapy: WFL for tasks assessed/performed Overall Cognitive Status: Within Functional Limits for tasks assessed                                        General Comments General comments (skin integrity, edema, etc.): VSS, pt reporting feeling mildly dizzy and woozy during session suspect due to medications and pt not yet having eaten breakfast    Exercises Other Exercises Other Exercises: Pt educated on falls prevention strategies, safe use of AE for fxl mobility, cervical precautions (handout provided), and compensatory strategies for ADL management in consideration of cervical precautions this date. Would benefit from review of techniques/opportunity to implement during ADL tasks.   Assessment/Plan     PT Assessment Patient needs continued PT services  PT Problem List Decreased strength;Decreased mobility;Decreased range of motion;Decreased activity tolerance;Decreased balance;Pain;Decreased knowledge of use of DME;Decreased knowledge of precautions       PT Treatment Interventions DME instruction;Therapeutic exercise;Gait training;Balance training;Stair training;Neuromuscular re-education;Functional mobility training;Therapeutic activities;Patient/family education    PT Goals (Current goals can be found in the Care Plan section)  Acute Rehab PT Goals Patient Stated Goal: To have less pain PT Goal Formulation: With patient Time For Goal Achievement: 10/30/19 Potential to Achieve Goals: Good    Frequency BID   Barriers to discharge        Co-evaluation               AM-PAC PT "6 Clicks" Mobility  Outcome Measure Help needed turning from your back to your side while in a flat bed without using bedrails?: A Little Help needed moving from lying on your back to sitting on the side of a flat bed without using bedrails?: A Little Help needed moving to and from a bed to a chair (including a wheelchair)?: A Little Help needed standing up from a chair using your arms (e.g., wheelchair or bedside chair)?: A Little Help needed to walk in hospital room?: A Little Help needed climbing 3-5 steps with a railing? : A Little 6 Click Score: 18    End of Session Equipment Utilized During Treatment: Gait belt Activity Tolerance: Patient limited by fatigue;Patient tolerated treatment well;Other (comment)(woozy) Patient left: in bed;with call bell/phone within reach;with SCD's reapplied;Other (comment)(bed in chair position) Nurse Communication: Mobility status PT Visit Diagnosis: Difficulty in walking, not elsewhere classified (R26.2);Other  abnormalities of gait and mobility (R26.89)    Time: [id number] PT Time Calculation (min) (ACUTE ONLY): 19 min   Charges:   PT Evaluation $PT Eval  Moderate Complexity: 1 Mod          Beverly Hughes PT, DPT 11:24 AM,10/16/19 [phone]   Beverly Hughes 10/16/2019, 11:20 AM

## 2019-10-16 NOTE — Plan of Care (Signed)
  Problem: Clinical Measurements: Goal: Ability to maintain clinical measurements within normal limits will improve Outcome: Progressing Goal: Diagnostic test results will improve Outcome: Progressing Goal: Respiratory complications will improve Outcome: Progressing Goal: Cardiovascular complication will be avoided Outcome: Progressing   Problem: Pain Managment: Goal: General experience of comfort will improve Outcome: Progressing   Problem: Safety: Goal: Ability to remain free from injury will improve Outcome: Progressing   Problem: Skin Integrity: Goal: Risk for impaired skin integrity will decrease Outcome: Progressing

## 2019-10-16 NOTE — Progress Notes (Signed)
Procedure: C5 corpectomy with C6-7 ACDF and C4-7 anterior spinal instrumentation Procedure date: 10/15/2019 Diagnosis: Cervical myelopathy   History: Beverly Hughes is s/p C5 corpectomy with C6-7 ACDF and C4-7 anterior spinal instrumentation for cervical myelopathy  POD1: Recovering well. Overnight she experienced headache, nausea and vomiting. This morning complains of persistent nausea/vomiting but it is improving. Denies headache. Upper extremity tingling and left lower extremity weakness has improved. Posterior neck pain 8/10 Voiding and tolerating soft food without issue.  Denies new upper or lower extremity pain/numbness/tingling/weakness.    Physical Exam: Vitals:   10/15/19 2322 10/16/19 0818  BP: (!) 149/75 128/87  Pulse: (!) 108 87  Resp: 17 18  Temp: 98.7 F (37.1 C) 97.6 F (36.4 C)  SpO2: 96% 95%    General: Alert and oriented, sitting upright in bed. Collar present Strength:5/5 throughout upper and lower extremities Sensation: intact and symmetric throughout upper and lower extremities Skin: glue intact at incision site.   Data:  Recent Labs  Lab 10/13/19 1246  NA 139  K 3.7  CL 103  CO2 27  BUN 13  CREATININE 0.60  GLUCOSE 94  CALCIUM 9.4   No results for input(s): AST, ALT, ALKPHOS in the last 168 hours.  Invalid input(s): TBILI   Recent Labs  Lab 10/13/19 1246  WBC 6.8  HGB 13.2  HCT 39.6  PLT 269   Recent Labs  Lab 10/13/19 1246  APTT 28  INR 1.0         Other tests/results:  EXAM: CERVICAL SPINE - 2-3 VIEW 10/16/2019  COMPARISON:  None.  FINDINGS: Postoperative changes with anterior fusion from C4 to C7. There is plate and screw fixation, corpectomy at C5 with interbody device placement, and additional interbody fusion device at C6-C7. There is expected preoperative soft tissue swelling and air.  IMPRESSION: Post anterior fusion at C4-C7 with C5 corpectomy. Assessment/Plan:  Beverly Hughes is POD1 s/p C5  corpectomy with C6-7 ACDF and C4-7 anterior spinal instrumentation. Will continue to monitor  - mobilize - pain control - DVT prophylaxis - PTOT - Brace - received - Imaging - completed  Marin Olp PA-C Department of Neurosurgery

## 2019-10-16 NOTE — Progress Notes (Signed)
Physical Therapy Treatment Patient Details Name: Beverly Hughes MRN: [id number] DOB: 1966-02-18 Today's Date: 10/16/2019    History of Present Illness Beverly Hughes is a 54 yo with PMH of degenerative disc disease and cervical myelopathy.  She presented with worsening symptoms and was advised for surgery. Pt is now s/p anterior cervical corpectomy of C5, anterior arthrodesis from C4 to C7, anterior cervical discectomy and fusion C6-7    PT Comments    Pt received sitting up in bed working on lunch tray and agreeable to PT. Pt still reporting cont nausea and feeling sick. Pt overall moving well with supervision assist.  Pt cont to decline ambulation within hallway. Pt ambulating within room safely with RW. Pt able to stand from toilet and walk to sink without RW with very guarded and cautious gait. Pt overall very guarded during mobility and scared of getting injured. Reviewed precautions with pt very concerned over performing daily tasks within precautions frequently asking if what she was doing was okay. Suspect pt is anxious and lacks confidence in her ability. When suggested ambulating without RW pt seemed anxious and scared about attempting and stating she needed RW. Pt reporting that she felt she was going to need to get a hospital bed to go home. Pt performed LE therex for continued strengthening and activity tolerance. Pt limited in pm session as well due to fatigue and feeling sick. Pt will benefit from cont acute therapy to progress functional mobility, improved confidence and independence. Recommendation remains appropriate.    Follow Up Recommendations  Home health PT     Equipment Recommendations  3in1 (PT);Rolling walker with 5" wheels    Recommendations for Other Services       Precautions / Restrictions Precautions Precautions: Cervical;Fall Precaution Comments: Moderate fall; no bending, arching, twisting. Required Braces or Orthoses: Cervical Brace Cervical Brace:  Hard collar Restrictions Weight Bearing Restrictions: No    Mobility  Bed Mobility Overal bed mobility: Needs Assistance Bed Mobility: Supine to Sit;Sit to Supine     Supine to sit: Supervision;HOB elevated Sit to supine: Supervision;HOB elevated   General bed mobility comments: supervision for safety, increased time to get EOB  Transfers Overall transfer level: Needs assistance Equipment used: Rolling walker (2 wheeled) Transfers: Sit to/from Stand Sit to Stand: Supervision         General transfer comment: supervision for safety, overall steady and safe, safe from transfer from bed and toilet  Ambulation/Gait Ambulation/Gait assistance: Supervision Gait Distance (Feet): 25 Feet Assistive device: Rolling walker (2 wheeled) Gait Pattern/deviations: Step-through pattern;Decreased stride length Gait velocity: dec   General Gait Details: pt ambulated within room and bathroom with Rw, pt able to take a few steps in restroom without RW, guarded and cautious due to pain, suspect pt lacks confidence in her ability and is afraid of messing anything up   Stairs             Wheelchair Mobility    Modified Rankin (Stroke Patients Only)       Balance Overall balance assessment: Needs assistance Sitting-balance support: Feet supported;No upper extremity supported Sitting balance-Leahy Scale: Good Sitting balance - Comments: steady EOB and reaching for items off tray   Standing balance support: During functional activity;Bilateral upper extremity supported Standing balance-Leahy Scale: Fair Standing balance comment: able to maintain static standing balance without UE support and able to take a few steps without RW, much more confident with RW  Cognition Arousal/Alertness: Lethargic Behavior During Therapy: WFL for tasks assessed/performed Overall Cognitive Status: Within Functional Limits for tasks assessed                                         Exercises Other Exercises Other Exercises: pt performed LE therex including LAQ, seated and standing march, SLR x10 each leg, repetitive STS from bed x5    General Comments General comments (skin integrity, edema, etc.): VSS, still mildly nauseous      Pertinent Vitals/Pain Pain Score: 7  Pain Location: neck Pain Descriptors / Indicators: Grimacing;Guarding;Sore;Radiating;Pressure Pain Intervention(s): Limited activity within patient's tolerance;Monitored during session;Repositioned    Home Living                      Prior Function            PT Goals (current goals can now be found in the care plan section) Progress towards PT goals: Progressing toward goals    Frequency    BID      PT Plan Current plan remains appropriate    Co-evaluation              AM-PAC PT "6 Clicks" Mobility   Outcome Measure  Help needed turning from your back to your side while in a flat bed without using bedrails?: A Little Help needed moving from lying on your back to sitting on the side of a flat bed without using bedrails?: A Little Help needed moving to and from a bed to a chair (including a wheelchair)?: A Little Help needed standing up from a chair using your arms (e.g., wheelchair or bedside chair)?: A Little Help needed to walk in hospital room?: A Little Help needed climbing 3-5 steps with a railing? : A Little 6 Click Score: 18    End of Session Equipment Utilized During Treatment: Gait belt Activity Tolerance: Patient tolerated treatment well Patient left: in bed;with call bell/phone within reach;with SCD's reapplied Nurse Communication: Mobility status PT Visit Diagnosis: Difficulty in walking, not elsewhere classified (R26.2);Other abnormalities of gait and mobility (R26.89)     Time: [id number] PT Time Calculation (min) (ACUTE ONLY): 28 min  Charges:  $Therapeutic Activity: 23-37 mins                     Jhonathan Desroches PT, DPT 4:14 PM,10/16/19 [phone]    Garvey Westcott Drucilla Chalet 10/16/2019, 4:13 PM

## 2019-10-16 NOTE — TOC Initial Note (Signed)
Transition of Care [facility]) - Initial/Assessment Note    Patient Details  Name: Beverly Hughes MRN: [id number] Date of Birth: [birth year]  Transition of [facility]) CM/SW Contact:    Shelbie Ammons, RN Phone Number: 10/16/2019, 12:57 PM  Clinical Narrative:   RNCM assessed patient at bedside. Patient reports to feeling ok today. Discussed CM role and what would be discussed and patient was agreeable to discussion. Discussed with patient that therapy is recommending she have PT/OT in home for which she is agreeable. Patient also reports that she needs assistance with bathing and dressing, discussed that this is not something that is not typically covered under insurance. Discussed that they are recommending she have a walker and 3n1 for which she is agreeable. Corene Cornea with [facility] accepted referral for PT/OT and Brad with [employer] is aware of equipment needs.              Expected Discharge Plan: [place] Barriers to Discharge: No Barriers Identified   Patient Goals and CMS Choice     Choice offered to / list presented to : Patient  Expected Discharge Plan and Services Expected Discharge Plan: [place]   Discharge Planning Services: CM Consult   Living arrangements for the past 2 months: Single Family Home                 DME Arranged: 3-N-1, Walker rolling DME Agency: [employer] Date DME Agency Contacted: 10/16/19 Time DME Agency Contacted: 6 Representative spoke with at DME Agency: [place]: PT, OT [place] Agency: [place] ([place]) Date [place]: 10/16/19 Time [place]: 48 Representative spoke with at [place]: Corene Cornea, [facility]  Prior Living Arrangements/Services Living arrangements for the past 2 months: [place] with:: Self Patient language and need for interpreter reviewed:: Yes Do you feel safe going back to the place where you live?: Yes      Need for Family Participation in Patient  Care: Yes (Comment) Care giver support system in place?: Yes (comment)   Criminal Activity/Legal Involvement Pertinent to Current Situation/Hospitalization: No - Comment as needed  Activities of Daily Living Home Assistive Devices/Equipment: None ADL Screening (condition at time of admission) Patient's cognitive ability adequate to safely complete daily activities?: Yes Is the patient deaf or have difficulty hearing?: No Does the patient have difficulty seeing, even when wearing glasses/contacts?: No Does the patient have difficulty concentrating, remembering, or making decisions?: No Patient able to express need for assistance with ADLs?: Yes Does the patient have difficulty dressing or bathing?: No Independently performs ADLs?: Yes (appropriate for developmental age) Does the patient have difficulty walking or climbing stairs?: No Weakness of Legs: None Weakness of Arms/Hands: None  Permission Sought/Granted                  Emotional Assessment Appearance:: Appears stated age Attitude/Demeanor/Rapport: Engaged Affect (typically observed): Appropriate Orientation: : Oriented to Self, Oriented to Place, Oriented to  Time, Oriented to Situation Alcohol / Substance Use: Not Applicable Psych Involvement: No (comment)  Admission diagnosis:  S/P cervical spinal fusion [Z98.1] Patient Active Problem List   Diagnosis Date Noted  . S/P cervical spinal fusion 10/15/2019  . Spinal stenosis of cervical region 03/05/2019  . Cervical cord myelomalacia ([place]) 01/14/2019   PCP:  System, Pcp Not In Pharmacy:   [facility] [address], [place] [address] Phone:  [phone] Fax: [phone]     Social Determinants of Health (SDOH) Interventions    Readmission Risk Interventions No flowsheet data found.

## 2019-10-17 MED ORDER — CELECOXIB 100 MG PO CAPS: 100.0000 mg | ORAL_CAPSULE | Freq: Two times a day (BID) | ORAL | 0 refills | Status: DC

## 2019-10-17 MED ORDER — SENNA 8.6 MG PO TABS: 1.0000 | ORAL_TABLET | Freq: Two times a day (BID) | ORAL | 0 refills | Status: DC

## 2019-10-17 MED ORDER — ONDANSETRON HCL 4 MG PO TABS: 4.0000 mg | ORAL_TABLET | Freq: Four times a day (QID) | ORAL | 0 refills | Status: DC | PRN

## 2019-10-17 MED ORDER — OXYCODONE HCL 5 MG PO TABS: 5.0000 mg | ORAL_TABLET | ORAL | 0 refills | Status: AC | PRN

## 2019-10-17 MED ORDER — METHOCARBAMOL 500 MG PO TABS: 500.0000 mg | ORAL_TABLET | Freq: Four times a day (QID) | ORAL | 0 refills | Status: DC

## 2019-10-17 NOTE — Progress Notes (Signed)
Patient discharged with belongings to home via w/c to personal vehicle with d/c summary , aftercare instructions , IV removed from Bilateral hands

## 2019-10-17 NOTE — Progress Notes (Signed)
Patient suffers from cervical spine surgery which requires repositioning in ways not feasible in a standard bed. Head of bed must be elevated greater than 30 degrees or severe pain occurs.

## 2019-10-17 NOTE — Discharge Instructions (Signed)
Your surgeon has performed an operation on your cervical spine (neck) to relieve pressure on the spinal cord and/or nerves. This involved making an incision in the front of your neck and removing one or more of the discs that support your spine. Next, a small piece of bone, a titanium plate, and screws were used to fuse two or more of the vertebrae (bones) together.  The following are instructions to help in your recovery once you have been discharged from the hospital. Even if you feel well, it is important that you follow these activity guidelines. If you do not let your neck heal properly from the surgery, you can increase the chance of return of your symptoms and other complications.  * Do not take anti-inflammatory medications for 3 months after surgery (naproxen [Aleve], ibuprofen [Advil, Motrin], etc.). These medications can prevent your bones from healing properly. * you may take tylenol, muscle relaxer, celebrex, and pain medications as needed for post op pain management * zofran is for nausea - take as needed.  * Senna is a stool softener. This will help to prevent constipation that can result from narcotic use.   Activity    No bending, lifting, or twisting ("BLT"). Avoid lifting objects heavier than 10 pounds (gallon milk jug).  Where possible, avoid household activities that involve lifting, bending, reaching, pushing, or pulling such as laundry, vacuuming, grocery shopping, and childcare. Try to arrange for help from friends and family for these activities while your back heals.  Increase physical activity slowly as tolerated.  Taking short walks is encouraged, but avoid strenuous exercise. Do not jog, run, bicycle, lift weights, or participate in any other exercises unless specifically allowed by your doctor.  Talk to your doctor before resuming sexual activity.  You should not drive until cleared by your doctor.  Until released by your doctor, you should not return to work or  school.  You should rest at home and let your body heal.   You may shower three days after your surgery.  After showering, lightly dab your incision dry. Do not take a tub bath or go swimming until approved by your doctor at your follow-up appointment.  If your doctor ordered a cervical collar (neck brace) for you, you should wear it whenever you are out of bed. You may remove it when lying down or sleeping, but you should wear it at all other times. Not all neck surgeries require a cervical collar.  If you smoke, we strongly recommend that you quit.  Smoking has been proven to interfere with normal bone healing and will dramatically reduce the success rate of your surgery. Please contact [facility] (800-QUIT-NOW) and use the resources at www.[facility].com for assistance in stopping smoking.  Surgical Incision   If you have a dressing on your incision, you may remove it two days after your surgery. Keep your incision area clean and dry.  If you have staples or stitches on your incision, you should have a follow up scheduled for removal. If you do not have staples or stitches, you will have steri-strips (small pieces of surgical tape) or Dermabond glue. The steri-strips/glue should begin to peel away within about a week (it is fine if the steri-strips fall off before then). If the strips are still in place one week after your surgery, you may gently remove them.  Diet           You may return to your usual diet. However, you may experience discomfort when swallowing in  the first month after your surgery. This is normal. You may find that softer foods are more comfortable for you to swallow. Be sure to stay hydrated.  When to Contact us  You may experience pain in your neck and/or pain between your shoulder blades. This is normal and should improve in the next few weeks with the help of pain medication, muscle relaxers, and rest. Some patients report that a warm compress on the back of the neck or  between the shoulder blades helps.  However, should you experience any of the following, contact us immediately: . New numbness or weakness . Pain that is progressively getting worse, and is not relieved by your pain medication, muscle relaxers, rest, and warm compresses . Bleeding, redness, swelling, pain, or drainage from surgical incision . Chills or flu-like symptoms . Fever greater than 101.0 F (38.3 C) . Inability to eat, drink fluids, or take medications . Problems with bowel or bladder functions . Difficulty breathing or shortness of breath . Warmth, tenderness, or swelling in your calf Contact Information . During office hours (Monday-Friday 9 am to 5 pm), please call your physician at [phone] and ask for Berdine Addison . After hours and weekends, please call [phone] and an answering service will put you in touch with either Dr. Lacinda Axon or Dr. Izora Ribas.  . For a life-threatening emergency, call 911

## 2019-10-17 NOTE — Discharge Summary (Signed)
Procedure: C5 corpectomy with C6-7 ACDF and C4-7 anterior spinal instrumentation Procedure date: 10/15/2019 Diagnosis: Cervical myelopathy   History: Beverly Hughes is s/p C5 corpectomy with C6-7 ACDF and C4-7 anterior spinal instrumentation for cervical myelopathy  POD 2: Continues to do well.  Pain rated 7/10 -back of the neck -improving daily and well controlled with current pain regimen.  Evaluated by PT and OT with recommendations of 3 in 1, rolling walker, home physical therapy and Occupational Therapy.  She is voiding and ambulating without issue.  Able to tolerate soft food and liquids without any issue. Bottom of the foot pain that she was experiencing before surgery has resolved.  She feels that strength in her upper and lower extremities has also improved.  Tingling in hands has resolved.   POD1: Recovering well. Overnight she experienced headache, nausea and vomiting. This morning complains of persistent nausea/vomiting but it is improving. Denies headache. Upper extremity tingling and left lower extremity weakness has improved. Posterior neck pain 8/10 Voiding and tolerating soft food without issue.  Denies new upper or lower extremity pain/numbness/tingling/weakness.    Physical Exam: Vitals:   10/16/19 2235 10/17/19 0810  BP: (!) 142/83 121/84  Pulse: (!) 110 94  Resp: 17 20  Temp: 98.5 F (36.9 C) 98.5 F (36.9 C)  SpO2:  96%    General: Alert and oriented, sitting upright in bed. Collar present Strength:5/5 throughout upper and lower extremities Sensation: intact and symmetric throughout upper and lower extremities Skin: glue intact at incision site.   Data:  Recent Labs  Lab 10/13/19 1246  NA 139  K 3.7  CL 103  CO2 27  BUN 13  CREATININE 0.60  GLUCOSE 94  CALCIUM 9.4   No results for input(s): AST, ALT, ALKPHOS in the last 168 hours.  Invalid input(s): TBILI   Recent Labs  Lab 10/13/19 1246  WBC 6.8  HGB 13.2  HCT 39.6  PLT 269   Recent  Labs  Lab 10/13/19 1246  APTT 28  INR 1.0         Other tests/results:  EXAM: CERVICAL SPINE - 2-3 VIEW 10/16/2019  COMPARISON:  None.  FINDINGS: Postoperative changes with anterior fusion from C4 to C7. There is plate and screw fixation, corpectomy at C5 with interbody device placement, and additional interbody fusion device at C6-C7. There is expected preoperative soft tissue swelling and air.  IMPRESSION: Post anterior fusion at C4-C7 with C5 corpectomy. Assessment/Plan:  Beverly Hughes is POD2 s/p C5 corpectomy with C6-7 ACDF and C4-7 anterior spinal instrumentation.  She is recovering well.  Ambulating, voiding, and eating without issue.  3 in 1 commode, rolling walker, home PT and OT ordered at discharge.  We will continue postop pain control with Tylenol, muscle relaxer, Celebrex as needed.  Also provided Zofran and senna as needed.  She is scheduled to follow-up in clinic in approximately 2 weeks to monitor progress.  Advised to contact office if any questions or concerns arise before then.  Marin Olp PA-C Department of Neurosurgery

## 2019-10-17 NOTE — TOC Progression Note (Signed)
Transition of Care [facility]) - Progression Note    Patient Details  Name: Beverly Hughes MRN: [id number] Date of Birth: Aug 01, 1965  Transition of Care [facility]) CM/SW Contact  Shelbie Ammons, RN Phone Number: 10/17/2019, 10:15 AM  Clinical Narrative:   RNCM contacted by Corene Cornea [facility], they are unable to accept patient. Contacted Brittney with [facility], they are unable to accept patient. Contacted Sarah with [facility], they are unable to accept patient.     Expected Discharge Plan: [place] Barriers to Discharge: No Barriers Identified  Expected Discharge Plan and Services Expected Discharge Plan: [place]   Discharge Planning Services: CM Consult   Living arrangements for the past 2 months: Single Family Home Expected Discharge Date: 10/17/19               DME Arranged: Berta Minor rolling DME Agency: [employer] Date DME Agency Contacted: 10/16/19 Time DME Agency Contacted: 39 Representative spoke with at DME Agency: [place]: PT, OT [place] Agency: [place] ([place]) Date [place]: 10/16/19 Time [place]: 1256 Representative spoke with at [place]: Corene Cornea, [facility]   Social Determinants of Health (SDOH) Interventions    Readmission Risk Interventions No flowsheet data found.

## 2019-10-17 NOTE — Progress Notes (Signed)
Physical Therapy Treatment Patient Details Name: Beverly Hughes MRN: [id number] DOB: 05-15-1966 Today's Date: 10/17/2019    History of Present Illness Beverly Hughes is a 54 yo with PMH of degenerative disc disease and cervical myelopathy.  She presented with worsening symptoms and was advised for surgery. Pt is now s/p anterior cervical corpectomy of C5, anterior arthrodesis from C4 to C7, anterior cervical discectomy and fusion C6-7    PT Comments    Pt was seated in recliner upon arriving. She agrees to PT session and is cooperative and pleasant throughout. Pt demonstrated safe ability to stand from recliner and ambulated 200 ft + ascend/descend 12 stair without assistance. Mod I for all mobility. Pt educated on spinal precautions and pt able to recall. Overall pt is progressing well with PT. CM having difficulty with offering HHPT but therapist feels pt can safely continue PT as outpatient if recommended by surgeon.    Follow Up Recommendations  Follow surgeon's recommendation for DC plan and follow-up therapies;Outpatient PT     Equipment Recommendations  3in1 (PT);Rolling walker with 5" wheels    Recommendations for Other Services       Precautions / Restrictions Precautions Precautions: Cervical;Fall Precaution Booklet Issued: Yes (comment) Precaution Comments: Moderate fall; no bending, arching, twisting. Required Braces or Orthoses: Cervical Brace Cervical Brace: Hard collar Restrictions Weight Bearing Restrictions: No    Mobility  Bed Mobility               General bed mobility comments: pt was seated in recliner pre/post session  Transfers Overall transfer level: Modified independent Equipment used: Rolling walker (2 wheeled) Transfers: Sit to/from Stand Sit to Stand: Modified independent (Device/Increase time)         General transfer comment: No physical assistance required to STS from/to recliner  Ambulation/Gait Ambulation/Gait assistance:  Modified independent (Device/Increase time) Gait Distance (Feet): 200 Feet Assistive device: Rolling walker (2 wheeled) Gait Pattern/deviations: WFL(Within Functional Limits) Gait velocity: WNL   General Gait Details: no LOB or uinsteadiness with gait   Stairs Stairs: Yes Stairs assistance: Supervision Stair Management: Two rails Number of Stairs: 12 General stair comments: pt demonstrated safe ability to ascend/descend 12 stairs with BUE support   Wheelchair Mobility    Modified Rankin (Stroke Patients Only)       Balance                                            Cognition Arousal/Alertness: Awake/alert Behavior During Therapy: WFL for tasks assessed/performed Overall Cognitive Status: Within Functional Limits for tasks assessed                                 General Comments: Pt very pleasant and cooperative throughout. A and O x 4      Exercises      General Comments        Pertinent Vitals/Pain Pain Assessment: 0-10 Pain Score: 4  Pain Location: neck Pain Descriptors / Indicators: Grimacing;Guarding;Sore;Radiating;Pressure Pain Intervention(s): Limited activity within patient's tolerance;Monitored during session    Home Living                      Prior Function            PT Goals (current goals can now be found in the care plan  section) Acute Rehab PT Goals Patient Stated Goal: " I'm ready to go home" Progress towards PT goals: Progressing toward goals    Frequency    BID      PT Plan Discharge plan needs to be updated    Co-evaluation              AM-PAC PT "6 Clicks" Mobility   Outcome Measure  Help needed turning from your back to your side while in a flat bed without using bedrails?: None Help needed moving from lying on your back to sitting on the side of a flat bed without using bedrails?: None Help needed moving to and from a bed to a chair (including a wheelchair)?: A  Little Help needed standing up from a chair using your arms (e.g., wheelchair or bedside chair)?: A Little Help needed to walk in hospital room?: A Little Help needed climbing 3-5 steps with a railing? : A Little 6 Click Score: 20    End of Session Equipment Utilized During Treatment: Gait belt Activity Tolerance: Patient tolerated treatment well Patient left: in chair;with call bell/phone within reach;with chair alarm set Nurse Communication: Mobility status PT Visit Diagnosis: Difficulty in walking, not elsewhere classified (R26.2);Other abnormalities of gait and mobility (R26.89)     Time: [id number] PT Time Calculation (min) (ACUTE ONLY): 15 min  Charges:  $Gait Training: 8-22 mins                     Julaine Fusi PTA 10/17/19, 11:37 AM

## 2019-10-17 NOTE — Progress Notes (Signed)
Occupational Therapy Treatment Patient Details Name: Beverly Hughes MRN: [id number] DOB: 03/10/1966 Today's Date: 10/17/2019    History of present illness Beverly Hughes is a 54 yo with PMH of degenerative disc disease and cervical myelopathy.  She presented with worsening symptoms and was advised for surgery. Pt is now s/p anterior cervical corpectomy of C5, anterior arthrodesis from C4 to C7, anterior cervical discectomy and fusion C6-7   OT comments  Pt seen for OT tx this afternoon. Pt with belongings packed and fully dressed, eager to return home once her parents arrive. Pt instructed in learned cervical precautions with teach back to maximize recall and carryover as well as instructed in strategies to support adherence and recall to maximize self mgt of medications and pain control. Pt verbalized understanding and declined additional acute OT needs at this time. Pt progressing towards goals.   Follow Up Recommendations  Home health OT;Supervision - Intermittent    Equipment Recommendations  3 in 1 bedside commode    Recommendations for Other Services      Precautions / Restrictions Precautions Precautions: Cervical;Fall Precaution Booklet Issued: No Precaution Comments: Moderate fall; no bending, arching, twisting. Required Braces or Orthoses: Cervical Brace Cervical Brace: Hard collar Restrictions Weight Bearing Restrictions: No       Mobility Bed Mobility Overal bed mobility: Needs Assistance Bed Mobility: Supine to Sit;Sit to Supine     Supine to sit: Supervision;HOB elevated Sit to supine: Supervision;HOB elevated   General bed mobility comments: pt was seated in recliner pre/post session  Transfers Overall transfer level: Modified independent Equipment used: Rolling walker (2 wheeled) Transfers: Sit to/from Stand Sit to Stand: Modified independent (Device/Increase time)         General transfer comment: No physical assistance required to STS from/to  recliner    Balance Overall balance assessment: Needs assistance Sitting-balance support: Feet supported;No upper extremity supported Sitting balance-Leahy Scale: Good     Standing balance support: During functional activity;Bilateral upper extremity supported Standing balance-Leahy Scale: Fair                             ADL either performed or assessed with clinical judgement   ADL Overall ADL's : Needs assistance/impaired                                       General ADL Comments: PRN Min A for LB ADL with cues for cervical precautions     Vision Baseline Vision/History: Wears glasses Wears Glasses: Reading only Patient Visual Report: No change from baseline     Perception     Praxis      Cognition Arousal/Alertness: Awake/alert Behavior During Therapy: WFL for tasks assessed/performed Overall Cognitive Status: Within Functional Limits for tasks assessed                                 General Comments: Pt very pleasant and cooperative throughout. A and O x 4        Exercises Other Exercises Other Exercises: pt instructed in review of learned cervical precautions with teach back; pt instructed in strategies to support adherence and recall to maximize self mgt of medications and pain control   Shoulder Instructions       General Comments      Pertinent Vitals/ Pain  Pain Assessment: Faces Pain Score: 4  Faces Pain Scale: Hurts little more Pain Location: neck Pain Descriptors / Indicators: Grimacing;Guarding;Sore;Radiating;Pressure Pain Intervention(s): Limited activity within patient's tolerance;Monitored during session;Repositioned  Home Living                                          Prior Functioning/Environment              Frequency  Min 2X/week        Progress Toward Goals  OT Goals(current goals can now be found in the care plan section)  Progress towards OT goals:  Progressing toward goals  Acute Rehab OT Goals Patient Stated Goal: " I'm ready to go home" OT Goal Formulation: With patient Time For Goal Achievement: 10/30/19 Potential to Achieve Goals: Good  Plan Discharge plan remains appropriate;Frequency remains appropriate    Co-evaluation                 AM-PAC OT "6 Clicks" Daily Activity     Outcome Measure   Help from another person eating meals?: A Little Help from another person taking care of personal grooming?: A Little Help from another person toileting, which includes using toliet, bedpan, or urinal?: A Little Help from another person bathing (including washing, rinsing, drying)?: A Little Help from another person to put on and taking off regular upper body clothing?: A Little Help from another person to put on and taking off regular lower body clothing?: A Little 6 Click Score: 18    End of Session    OT Visit Diagnosis: Other abnormalities of gait and mobility (R26.89);Muscle weakness (generalized) (M62.81)   Activity Tolerance Patient tolerated treatment well   Patient Left in bed;with call bell/phone within reach   Nurse Communication          Time: [id number] OT Time Calculation (min): 9 min  Charges: OT General Charges $OT Visit: 1 Visit OT Treatments $Self Care/Home Management : 8-22 mins  Jeni Salles, MPH, MS, OTR/L ascom [phone] 10/17/19, 2:30 PM

## 2019-11-06 ENCOUNTER — Encounter (HOSPITAL_COMMUNITY): Payer: Self-pay | Admitting: Emergency Medicine

## 2019-11-06 ENCOUNTER — Emergency Department (HOSPITAL_COMMUNITY)
Admission: EM | Admit: 2019-11-06 | Discharge: 2019-11-06 | Disposition: A | Discharge status: Home / Self Care | Payer: 59 | Attending: Emergency Medicine | Admitting: Emergency Medicine

## 2019-11-06 ENCOUNTER — Other Ambulatory Visit: Payer: Self-pay | Admitting: Neurosurgery

## 2019-11-06 ENCOUNTER — Emergency Department (HOSPITAL_COMMUNITY): Payer: 59

## 2019-11-06 ENCOUNTER — Other Ambulatory Visit: Payer: Self-pay

## 2019-11-06 DIAGNOSIS — Z9889 Other specified postprocedural states: Secondary | ICD-10-CM | POA: Insufficient documentation

## 2019-11-06 DIAGNOSIS — M542 Cervicalgia: Secondary | ICD-10-CM

## 2019-11-06 DIAGNOSIS — R519 Headache, unspecified: Secondary | ICD-10-CM

## 2019-11-06 DIAGNOSIS — M545 Low back pain, unspecified: Secondary | ICD-10-CM

## 2019-11-06 LAB — CBC WITH DIFFERENTIAL/PLATELET
Abs Immature Granulocytes: 0.01 10*3/uL (ref 0.00–0.07)
Basophils Absolute: 0 10*3/uL (ref 0.0–0.1)
Basophils Relative: 1 %
Eosinophils Absolute: 0.4 10*3/uL (ref 0.0–0.5)
Eosinophils Relative: 7 %
HCT: 36.6 % (ref 36.0–46.0)
Hemoglobin: 11.6 g/dL — ABNORMAL LOW (ref 12.0–15.0)
Immature Granulocytes: 0 %
Lymphocytes Relative: 24 %
Lymphs Abs: 1.5 10*3/uL (ref 0.7–4.0)
MCH: 28.7 pg (ref 26.0–34.0)
MCHC: 31.7 g/dL (ref 30.0–36.0)
MCV: 90.6 fL (ref 80.0–100.0)
Monocytes Absolute: 0.6 10*3/uL (ref 0.1–1.0)
Monocytes Relative: 9 %
Neutro Abs: 3.6 10*3/uL (ref 1.7–7.7)
Neutrophils Relative %: 59 %
Platelets: 419 10*3/uL — ABNORMAL HIGH (ref 150–400)
RBC: 4.04 MIL/uL (ref 3.87–5.11)
RDW: 12.4 % (ref 11.5–15.5)
WBC: 6.1 10*3/uL (ref 4.0–10.5)
nRBC: 0 % (ref 0.0–0.2)

## 2019-11-06 LAB — BASIC METABOLIC PANEL
Anion gap: 10 (ref 5–15)
BUN: 7 mg/dL (ref 6–20)
CO2: 26 mmol/L (ref 22–32)
Calcium: 9.5 mg/dL (ref 8.9–10.3)
Chloride: 103 mmol/L (ref 98–111)
Creatinine, Ser: 0.65 mg/dL (ref 0.44–1.00)
GFR calc Af Amer: >60 mL/min (ref >60)
GFR calc non Af Amer: >60 mL/min (ref >60)
Glucose, Bld: 106 mg/dL — ABNORMAL HIGH (ref 70–99)
Potassium: 3.9 mmol/L (ref 3.5–5.1)
Sodium: 139 mmol/L (ref 135–145)

## 2019-11-06 MED ORDER — ONDANSETRON 4 MG PO TBDP
8.0000 mg | ORAL_TABLET | Freq: Once | ORAL | Status: AC
  Administered 2019-11-06: 8 mg via ORAL
  Filled 2019-11-06: qty 2

## 2019-11-06 MED ORDER — HYDROCODONE-ACETAMINOPHEN 5-325 MG PO TABS
2.0000 | ORAL_TABLET | Freq: Once | ORAL | Status: AC
  Administered 2019-11-06: 2 via ORAL
  Filled 2019-11-06: qty 2

## 2019-11-06 NOTE — ED Notes (Signed)
CT did not relay that pt was unable to tolerate CT scan so scans were never performed. Spoke with pt who states she was able to get an appt today to f/u. Advised EDP who okay'ed pt to be discharged to f/u outpatient.

## 2019-11-06 NOTE — Discharge Instructions (Addendum)
It was our pleasure to provide your ER care today - we hope that you feel better.  Dr Izora Ribas indicates you have an appointment there at 4:15 today - please go to that appointment then.   Also, your blood pressure is high today - follow up with primary care doctor 1-2 weeks.   Return to ER if worse, new symptoms, worsening or severe pain, fevers, persistent vomiting, numbness/weakness, or other concern.   You were given pain medication in the ER - no driving for the next 6 hours.

## 2019-11-06 NOTE — ED Triage Notes (Signed)
Patient reports frontal headache for several weeks worse yesterday , denies head injury , no emesis or diaphoresis , denies fever or blurred vision .

## 2019-11-06 NOTE — ED Notes (Signed)
Dr. Ashok Cordia aware of patient unable to tolerate CT orders @ 1145am. Orlinda Blalock spoke to Dr Ashok Cordia directly.

## 2019-11-06 NOTE — ED Provider Notes (Addendum)
[facility] Provider Note   CSN: [id number] Arrival date & time: 11/06/19  [id number]     History Chief Complaint  Patient presents with  . Headache    Beverly Hughes is a 54 y.o. female.  Patient is 3 weeks s/p cervical fusion surgery, and indicates ever since surgery dull, diffuse, headache. Symptoms gradual onset post surgery, persistent, constant, moderate, without acute or abrupt change in the past 1-2 days. No positional change, headache no different whether upright or supine.  Denies prior hx migraines or other chronic cephalgia. Denies eye pain, no photophobia or phonophobia. No change in vision. States neck sore ever since surgery, takes pain medication for same, denies acute or abrupt change in neck pain. Also c/o low back pain, dull, moderate, non radiating, bilateral lumbar area. Denies syncope, trauma, fall, or neck/pain strain. Denies numbness or weakness. No fever/chills/sweats. No vomiting. No dysuria or gu c/o. No cough or uri symptoms.   The history is provided by the patient.  Headache Associated symptoms: back pain and neck pain   Associated symptoms: no abdominal pain, no cough, no fever, no numbness, no sore throat, no vomiting and no weakness        Past Medical History:  Diagnosis Date  . Allergy   . Mixed hyperlipidemia   . Nut allergy    [place] nut    Patient Active Problem List   Diagnosis Date Noted  . S/P cervical spinal fusion 10/15/2019  . Spinal stenosis of cervical region 03/05/2019  . Cervical cord myelomalacia ([place]) 01/14/2019    Past Surgical History:  Procedure Laterality Date  . ANTERIOR CERVICAL CORPECTOMY N/A 10/15/2019   Procedure: ANTERIOR CERVICAL CORPECTOMY C5;  Surgeon: Meade Maw, MD;  Location: [facility] ORS;  Service: Neurosurgery;  Laterality: N/A;  . ANTERIOR CERVICAL DECOMP/DISCECTOMY FUSION N/A 10/15/2019   Procedure: C6-7 ANTERIOR CERVICAL DISCECTOMY AND FUSION, C4-7 ANTERIOR SPINAL  INSTRUMENTATION;  Surgeon: Meade Maw, MD;  Location: [facility] ORS;  Service: Neurosurgery;  Laterality: N/A;  . EYE SURGERY     tear duct repair  . FRACTURE SURGERY     left foot fracture at 54 yrs old  . TUBAL LIGATION       OB History   No obstetric history on file.     Family History  Problem Relation Age of Onset  . Diabetes Mother   . Kidney Stones Mother   . Heart disease Mother   . Cancer Father   . Prostate cancer Father   . Diabetes Maternal Grandmother   . Heart disease Maternal Grandmother   . Hypertension Maternal Grandmother   . Diabetes Maternal Grandfather   . Heart disease Maternal Grandfather   . Hypertension Maternal Grandfather   . Colon cancer Paternal Grandmother   . Hypertension Paternal Grandmother     Social History   Tobacco Use  . Smoking status: Never Smoker  . Smokeless tobacco: Never Used  Substance Use Topics  . Alcohol use: Yes    Alcohol/week: 2.0 standard drinks    Types: 1 Cans of beer, 1 Glasses of wine per week    Comment: socially  . Drug use: No    Home Medications Prior to Admission medications   Medication Sig Start Date End Date Taking? Authorizing Provider  Calcium-Magnesium-Zinc (CALCIUM-MAGNESUIUM-ZINC PO) Take 1 tablet by mouth in the morning, at noon, and at bedtime.    [provider]  celecoxib (CELEBREX) 100 MG capsule Take 1 capsule (100 mg total) by mouth 2 (  two) times daily. 10/17/19   Marin Olp, PA-C  methocarbamol (ROBAXIN) 500 MG tablet Take 1 tablet (500 mg total) by mouth 4 (four) times daily. 10/17/19   Marin Olp, PA-C  ondansetron (ZOFRAN) 4 MG tablet Take 1 tablet (4 mg total) by mouth every 6 (six) hours as needed for nausea or vomiting. 10/17/19   Marin Olp, PA-C  senna (SENOKOT) 8.6 MG TABS tablet Take 1 tablet (8.6 mg total) by mouth 2 (two) times daily. 10/17/19   Marin Olp, PA-C    Allergies    Other and Codeine sulfate  Review of Systems   Review of Systems    Constitutional: Negative for fever.  HENT: Negative for sinus pain and sore throat.   Eyes: Negative for redness.  Respiratory: Negative for cough and shortness of breath.   Cardiovascular: Negative for chest pain.  Gastrointestinal: Negative for abdominal pain and vomiting.  Genitourinary: Negative for dysuria.  Musculoskeletal: Positive for back pain and neck pain.  Skin: Negative for rash.  Neurological: Positive for headaches. Negative for speech difficulty, weakness and numbness.  Hematological: Does not bruise/bleed easily.  Psychiatric/Behavioral: Negative for confusion.    Physical Exam Updated Vital Signs BP (!) 161/94 (BP Location: Left Arm)   Pulse (!) 108   Temp 98.8 F (37.1 C) (Oral)   Resp 16   Ht 1.626 m (5\' 4" )   Wt 95 kg   SpO2 96%   BMI 35.95 kg/m   Physical Exam Vitals and nursing note reviewed.  Constitutional:      Appearance: Normal appearance. She is well-developed.  HENT:     Head: Atraumatic.     Comments: No sinus or temporal tenderness.     Nose: Nose normal.     Mouth/Throat:     Mouth: Mucous membranes are moist.  Eyes:     General: No scleral icterus.    Conjunctiva/sclera: Conjunctivae normal.     Pupils: Pupils are equal, round, and reactive to light.  Neck:     Trachea: No tracheal deviation.     Comments: Incision healing well, no sign of infection.  Cardiovascular:     Rate and Rhythm: Normal rate and regular rhythm.     Pulses: Normal pulses.     Heart sounds: Normal heart sounds. No murmur. No friction rub. No gallop.   Pulmonary:     Effort: Pulmonary effort is normal. No respiratory distress.     Breath sounds: Normal breath sounds.  Abdominal:     General: Bowel sounds are normal. There is no distension.     Palpations: Abdomen is soft.     Tenderness: There is no abdominal tenderness. There is no guarding.  Genitourinary:    Comments: No cva tenderness.  Musculoskeletal:        General: No swelling.     Cervical  back: Normal range of motion and neck supple. No rigidity. No muscular tenderness.     Comments: CTLS spine, non tender, aligned, no step off. Lumbar muscular tenderness.   Skin:    General: Skin is warm and dry.     Findings: No rash.  Neurological:     Mental Status: She is alert.     Comments: Alert, speech normal/fluent. Motor intact bil, stre 5/5. Sens grossly intact. Steady gait.   Psychiatric:        Mood and Affect: Mood normal.     ED Results / Procedures / Treatments   Labs (all labs ordered are listed, but only abnormal results  are displayed) Results for orders placed or performed during the hospital encounter of 11/06/19  CBC with Differential  Result Value Ref Range   WBC 6.1 4.0 - 10.5 K/uL   RBC 4.04 3.87 - 5.11 MIL/uL   Hemoglobin 11.6 (L) 12.0 - 15.0 g/dL   HCT 36.6 36.0 - 46.0 %   MCV 90.6 80.0 - 100.0 fL   MCH 28.7 26.0 - 34.0 pg   MCHC 31.7 30.0 - 36.0 g/dL   RDW 12.4 11.5 - 15.5 %   Platelets 419 (H) 150 - 400 K/uL   nRBC 0.0 0.0 - 0.2 %   Neutrophils Relative % 59 %   Neutro Abs 3.6 1.7 - 7.7 K/uL   Lymphocytes Relative 24 %   Lymphs Abs 1.5 0.7 - 4.0 K/uL   Monocytes Relative 9 %   Monocytes Absolute 0.6 0.1 - 1.0 K/uL   Eosinophils Relative 7 %   Eosinophils Absolute 0.4 0.0 - 0.5 K/uL   Basophils Relative 1 %   Basophils Absolute 0.0 0.0 - 0.1 K/uL   Immature Granulocytes 0 %   Abs Immature Granulocytes 0.01 0.00 - 0.07 K/uL  Basic metabolic panel  Result Value Ref Range   Sodium 139 135 - 145 mmol/L   Potassium 3.9 3.5 - 5.1 mmol/L   Chloride 103 98 - 111 mmol/L   CO2 26 22 - 32 mmol/L   Glucose, Bld 106 (H) 70 - 99 mg/dL   BUN 7 6 - 20 mg/dL   Creatinine, Ser 0.65 0.44 - 1.00 mg/dL   Calcium 9.5 8.9 - 10.3 mg/dL   GFR calc non Af Amer >60 >60 mL/min   GFR calc Af Amer >60 >60 mL/min   Anion gap 10 5 - 15   DG Cervical Spine 2 or 3 views  Result Date: 10/16/2019 CLINICAL DATA:  Post cervical fusion EXAM: CERVICAL SPINE - 2-3 VIEW  COMPARISON:  None. FINDINGS: Postoperative changes with anterior fusion from C4 to C7. There is plate and screw fixation, corpectomy at C5 with interbody device placement, and additional interbody fusion device at C6-C7. There is expected preoperative soft tissue swelling and air. IMPRESSION: Post anterior fusion at C4-C7 with C5 corpectomy. Electronically Signed   By: Macy Mis M.D.   On: 10/16/2019 08:38   DG Cervical Spine 2-3 Views  Result Date: 10/15/2019 CLINICAL DATA:  Post anterior cervical fusion with corpectomy. EXAM: CERVICAL SPINE - 2-3 VIEW; DG C-ARM 1-60 MIN-NO REPORT; DG C-ARM 1-60 MIN COMPARISON:  02/19/2019 FINDINGS: Limited intraoperative images are submitted. A total of 4 images. Initial image with hemostat directed at the C4-5 interspace and surgical retractors in place over the anterior neck obtained in the lateral projection. The second showing signs of corpectomy at C5 with insertion of by mechanical device and overlying retractors and hardware with screws at the C4 and C6 levels. Third image showing partial visualization on the lateral view of C4 through C7 spinal fusion with anterior plate and screw fixation spanning the C5 corpectomy level. Final image in the AP projection with plate and screw fixation showing no signs of hardware complication. Marker for sponges is in place along the anterolateral neck on the left. Endotracheal tube partially visualized. FLUOROSCOPY TIME:  7.4 seconds IMPRESSION: Limited intraoperative images of the cervical spine as described above. C4 through C7 spinal fusion following C5 corpectomy with marker for sponges over the left neck as described on this intraoperative radiograph. These results will be called to the ordering clinician or representative by  the Psychologist, clinical, and communication documented in the PACS or [employer]. Presence of sponges in the soft tissues as evidence by radiopaque marker likely reflects intraoperative nature of  the film. Correlate with operative history. Electronically Signed   By: Zetta Bills M.D.   On: 10/15/2019 11:22   DG C-Arm 1-60 Min  Result Date: 10/15/2019 CLINICAL DATA:  Post anterior cervical fusion with corpectomy. EXAM: CERVICAL SPINE - 2-3 VIEW; DG C-ARM 1-60 MIN-NO REPORT; DG C-ARM 1-60 MIN COMPARISON:  02/19/2019 FINDINGS: Limited intraoperative images are submitted. A total of 4 images. Initial image with hemostat directed at the C4-5 interspace and surgical retractors in place over the anterior neck obtained in the lateral projection. The second showing signs of corpectomy at C5 with insertion of by mechanical device and overlying retractors and hardware with screws at the C4 and C6 levels. Third image showing partial visualization on the lateral view of C4 through C7 spinal fusion with anterior plate and screw fixation spanning the C5 corpectomy level. Final image in the AP projection with plate and screw fixation showing no signs of hardware complication. Marker for sponges is in place along the anterolateral neck on the left. Endotracheal tube partially visualized. FLUOROSCOPY TIME:  7.4 seconds IMPRESSION: Limited intraoperative images of the cervical spine as described above. C4 through C7 spinal fusion following C5 corpectomy with marker for sponges over the left neck as described on this intraoperative radiograph. These results will be called to the ordering clinician or representative by the Radiologist Assistant, and communication documented in the PACS or [employer]. Presence of sponges in the soft tissues as evidence by radiopaque marker likely reflects intraoperative nature of the film. Correlate with operative history. Electronically Signed   By: Zetta Bills M.D.   On: 10/15/2019 11:22   DG C-Arm 1-60 Min  Result Date: 10/15/2019 CLINICAL DATA:  Cervical surgery. EXAM: DG C-ARM 1-60 MIN COMPARISON:  03/01/2019 FINDINGS: Initial C-arm image shows a probe at the anterior  disc space of C4-5. Second film shows C5 corpectomy in progress. Anterior screws are in place at C4 and C6. Final image shows anterior plate and screw placement from C4 to C7. Components appear well position. IMPRESSION: C5 corpectomy and ACDF C4 through C7. Electronically Signed   By: Nelson Chimes M.D.   On: 10/15/2019 11:18   DG C-Arm 1-60 Min-No Report  Result Date: 10/15/2019 CLINICAL DATA:  Post anterior cervical fusion with corpectomy. EXAM: CERVICAL SPINE - 2-3 VIEW; DG C-ARM 1-60 MIN-NO REPORT; DG C-ARM 1-60 MIN COMPARISON:  02/19/2019 FINDINGS: Limited intraoperative images are submitted. A total of 4 images. Initial image with hemostat directed at the C4-5 interspace and surgical retractors in place over the anterior neck obtained in the lateral projection. The second showing signs of corpectomy at C5 with insertion of by mechanical device and overlying retractors and hardware with screws at the C4 and C6 levels. Third image showing partial visualization on the lateral view of C4 through C7 spinal fusion with anterior plate and screw fixation spanning the C5 corpectomy level. Final image in the AP projection with plate and screw fixation showing no signs of hardware complication. Marker for sponges is in place along the anterolateral neck on the left. Endotracheal tube partially visualized. FLUOROSCOPY TIME:  7.4 seconds IMPRESSION: Limited intraoperative images of the cervical spine as described above. C4 through C7 spinal fusion following C5 corpectomy with marker for sponges over the left neck as described on this intraoperative radiograph. These results will be  called to the ordering clinician or representative by the Radiologist Assistant, and communication documented in the PACS or [employer]. Presence of sponges in the soft tissues as evidence by radiopaque marker likely reflects intraoperative nature of the film. Correlate with operative history. Electronically Signed   By: Zetta Bills  M.D.   On: 10/15/2019 11:22    EKG None  Radiology No results found.  Procedures Procedures (including critical care time)  Medications Ordered in ED Medications  HYDROcodone-acetaminophen (NORCO/VICODIN) 5-325 MG per tablet 2 tablet (has no administration in time range)  ondansetron (ZOFRAN-ODT) disintegrating tablet 8 mg (has no administration in time range)    ED Course  I have reviewed the triage vital signs and the nursing notes.  Pertinent labs & imaging results that were available during my care of the patient were reviewed by me and considered in my medical decision making (see chart for details).    MDM Rules/Calculators/A&P                      Labs sent.   Reviewed nursing notes and prior charts for additional history.  Cervical fusion surgery 3/31.  Labs reviewed/interpreted by me - wbc normal. Chem normal.   Corresponded with pts surgeon via secure chat - he has been aware of her recent symptoms, and will see in office today:   Patient requests ct/imaging.   Pt indicates no pain meds this AM, request something for pain. Hydrocodone po. zofran po.   Recheck pain resolved.   Note that after initially requesting imaging, pt subsequently declines ct and/or other imaging - she indicates she prefers to f/u with her surgeon today, and requests d/c. Pt indicates her pain is resolved, and will f/u later this afternoon, but continues to decline imaging studies in ED.   Patient d/c from ED to f/u this afternoon.   Return precautions provided.      Final Clinical Impression(s) / ED Diagnoses Final diagnoses:  None    Rx / DC Orders ED Discharge Orders    None         Lajean Saver, MD 11/06/19 [phone]

## 2019-12-12 IMAGING — CR DG CHEST 2V
2 series · 2 of 2 positions shown · non-contrast
Comparison: None.

CLINICAL DATA: Preop ACDF.

EXAM:
CHEST - 2 VIEW

[w chest pa]
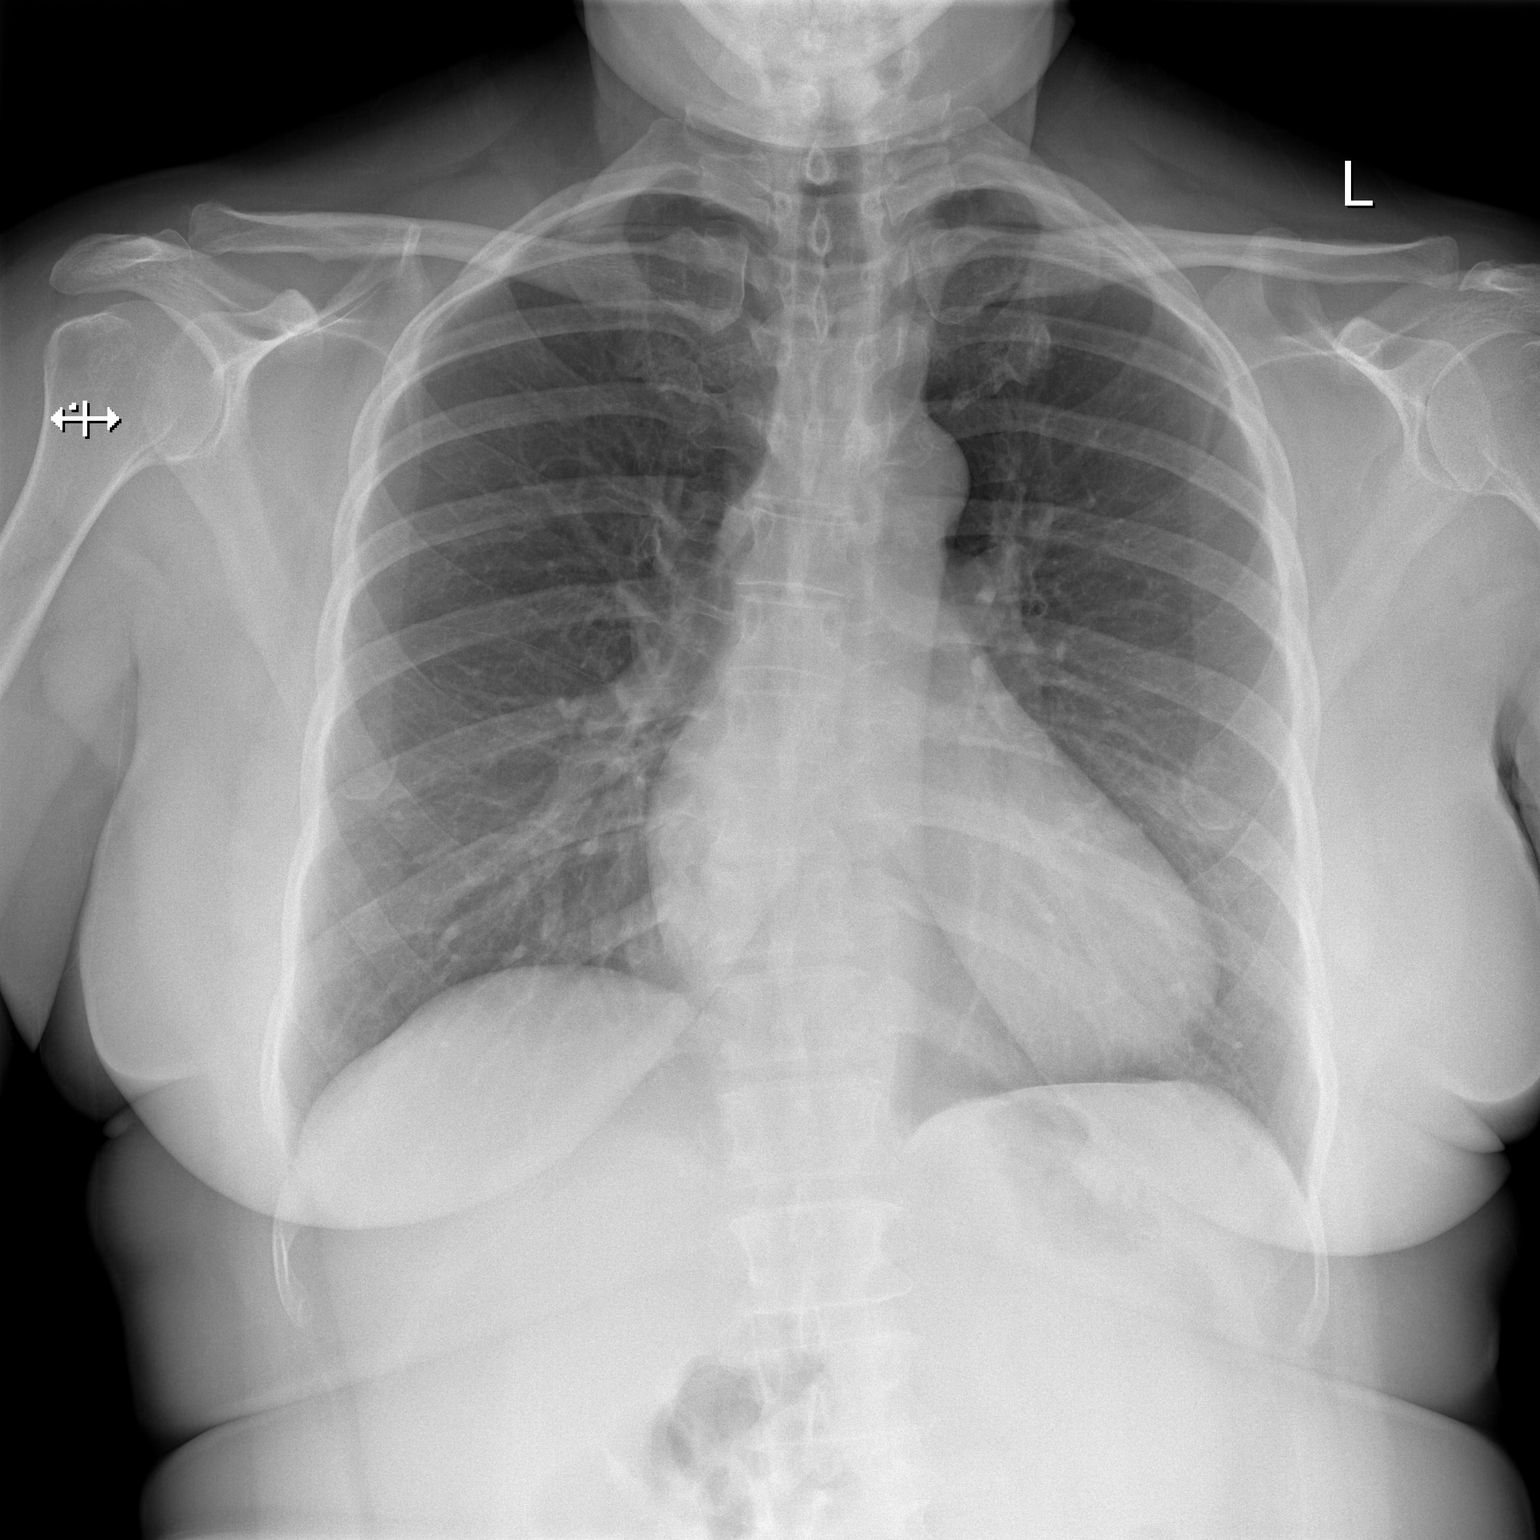

[w chest lat]
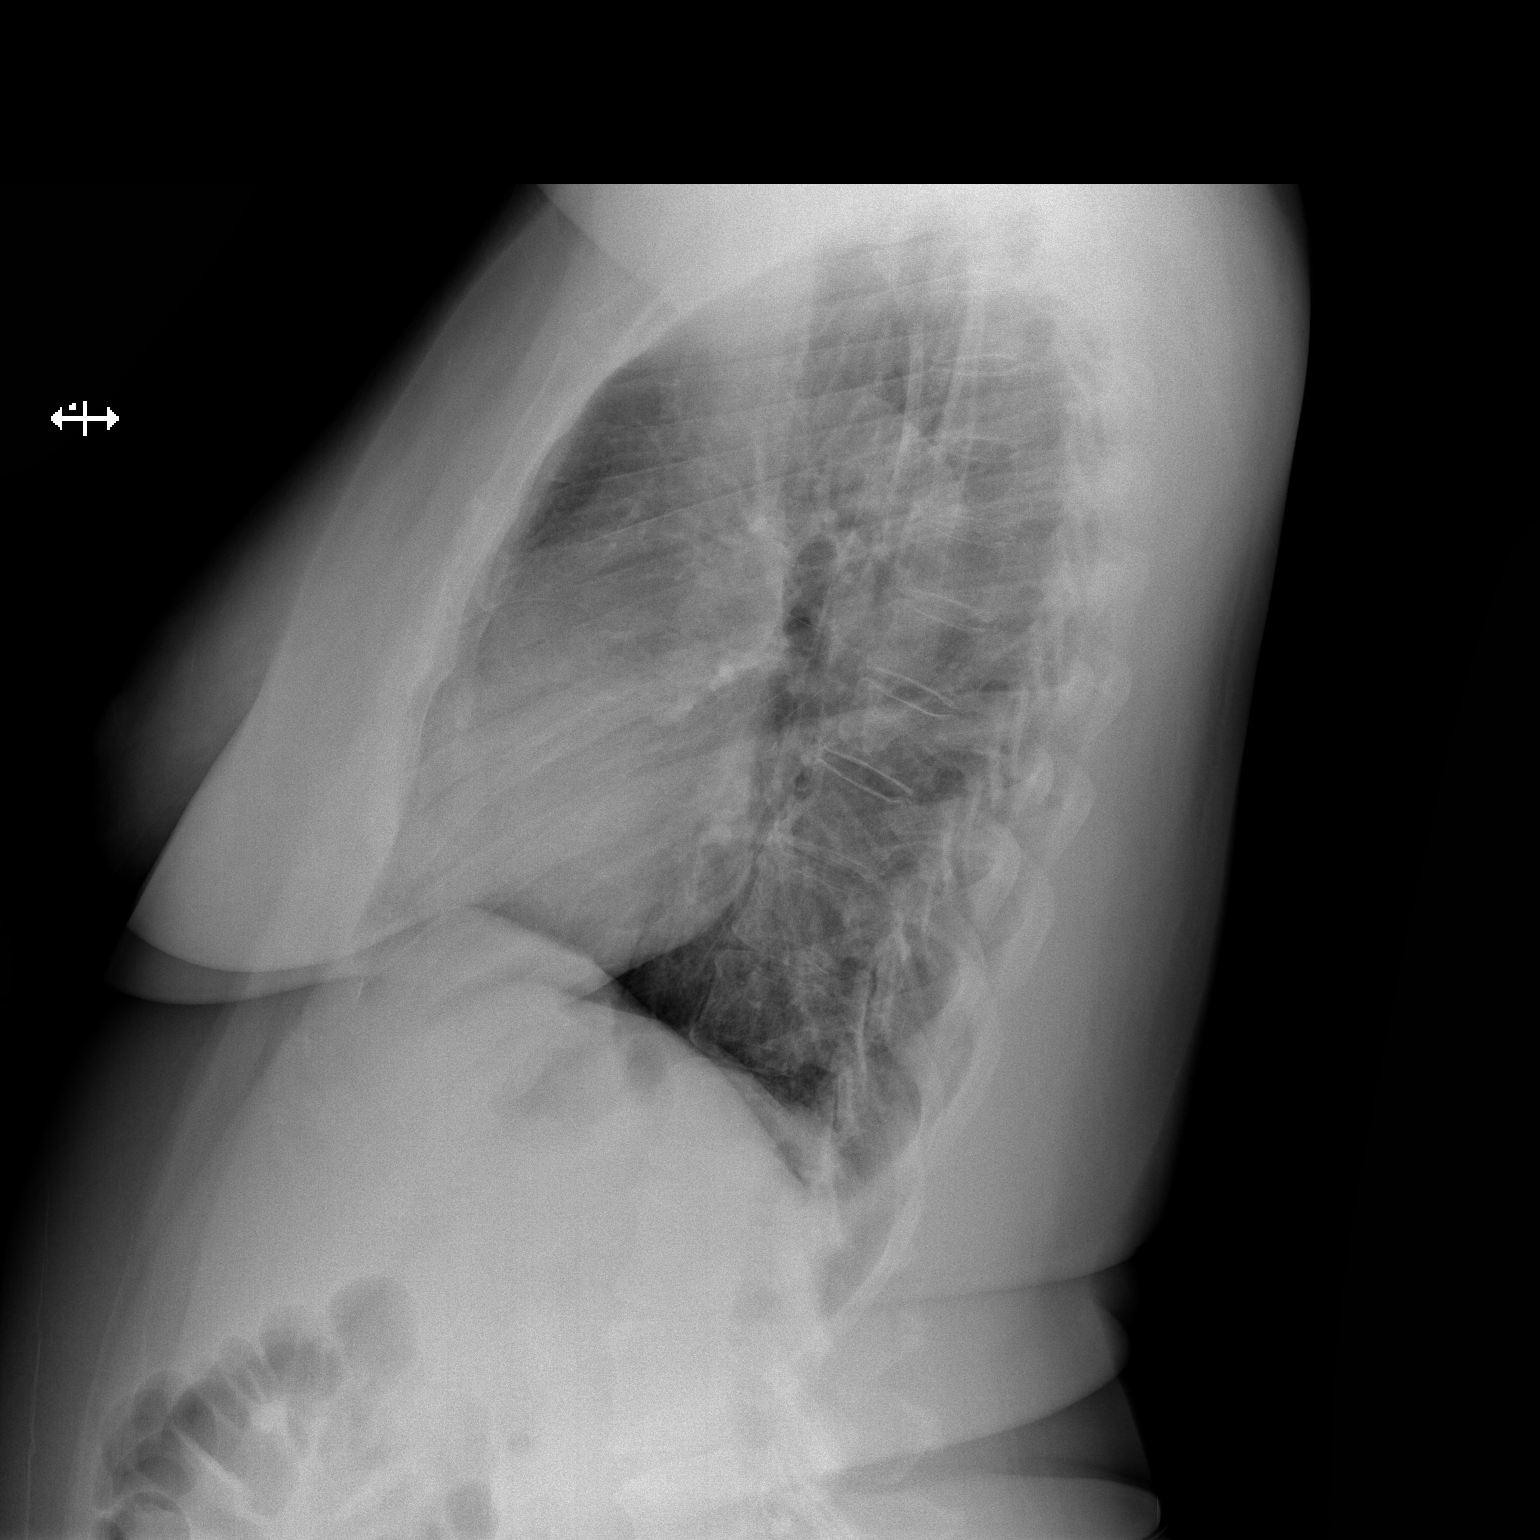

[2 of 2 positions shown; findings below may reference images not displayed]

FINDINGS: The heart size and mediastinal contours are within normal limits.
Both lungs are clear. The visualized skeletal structures are
unremarkable.
IMPRESSION: No active cardiopulmonary disease.

## 2020-01-15 ENCOUNTER — Other Ambulatory Visit (HOSPITAL_COMMUNITY): Payer: Self-pay

## 2020-01-15 DIAGNOSIS — R1310 Dysphagia, unspecified: Secondary | ICD-10-CM

## 2020-01-22 ENCOUNTER — Other Ambulatory Visit: Payer: Self-pay

## 2020-01-22 ENCOUNTER — Ambulatory Visit (HOSPITAL_COMMUNITY)
Admission: RE | Admit: 2020-01-22 | Discharge: 2020-01-22 | Disposition: A | Discharge status: Home / Self Care | Payer: 59 | Source: Ambulatory Visit | Attending: Otolaryngology | Admitting: Otolaryngology

## 2020-01-22 DIAGNOSIS — R1310 Dysphagia, unspecified: Secondary | ICD-10-CM | POA: Diagnosis not present

## 2020-01-22 NOTE — Progress Notes (Signed)
Modified Barium Swallow Progress Note  Patient Details  Name: JLYN CERROS MRN: [id number] Date of Birth: 1965/08/21  Today's Date: 01/22/2020  Modified Barium Swallow completed.  Full report located under Chart Review in the Imaging Section.  Brief recommendations include the following:  Clinical Impression  Pt demonstrates no sign of pharyngeal dysphagia. At the most there is slight coating of barium at the base of vallecula and pyriform sinus that would not be considered residue/retention at all. Pt does report globus and visual feedback given to reinforce lack of residue. No aspiration or penetration occurred. The ACDF hardware does put pressure of the UES and likely the posterior laryngeal structures as well. SLP and pt discussed hypersensitivty from change in anatomical structures and strategies to reduce laryngeal inflammation (drinking water to supress chronic cough/throat clearing behaviors and thin secretions, etc). No further SLP f/u needed.    Swallow Evaluation Recommendations       SLP Diet Recommendations: Regular solids;Thin liquid   Liquid Administration via: Cup;Straw   Medication Administration: Whole meds with liquid   Supervision: Patient able to self feed                   Herbie [place], MA [place] Pager [phone] Office [phone]  Lynann Beaver 01/22/2020,1:06 PM

## 2020-07-03 IMAGING — CR DG CERVICAL SPINE 2 OR 3 VIEWS
2 series · 2 of 2 positions shown · non-contrast
Comparison: None.

CLINICAL DATA: Post cervical fusion

EXAM:
CERVICAL SPINE - 2-3 VIEW

[c-spine lat]
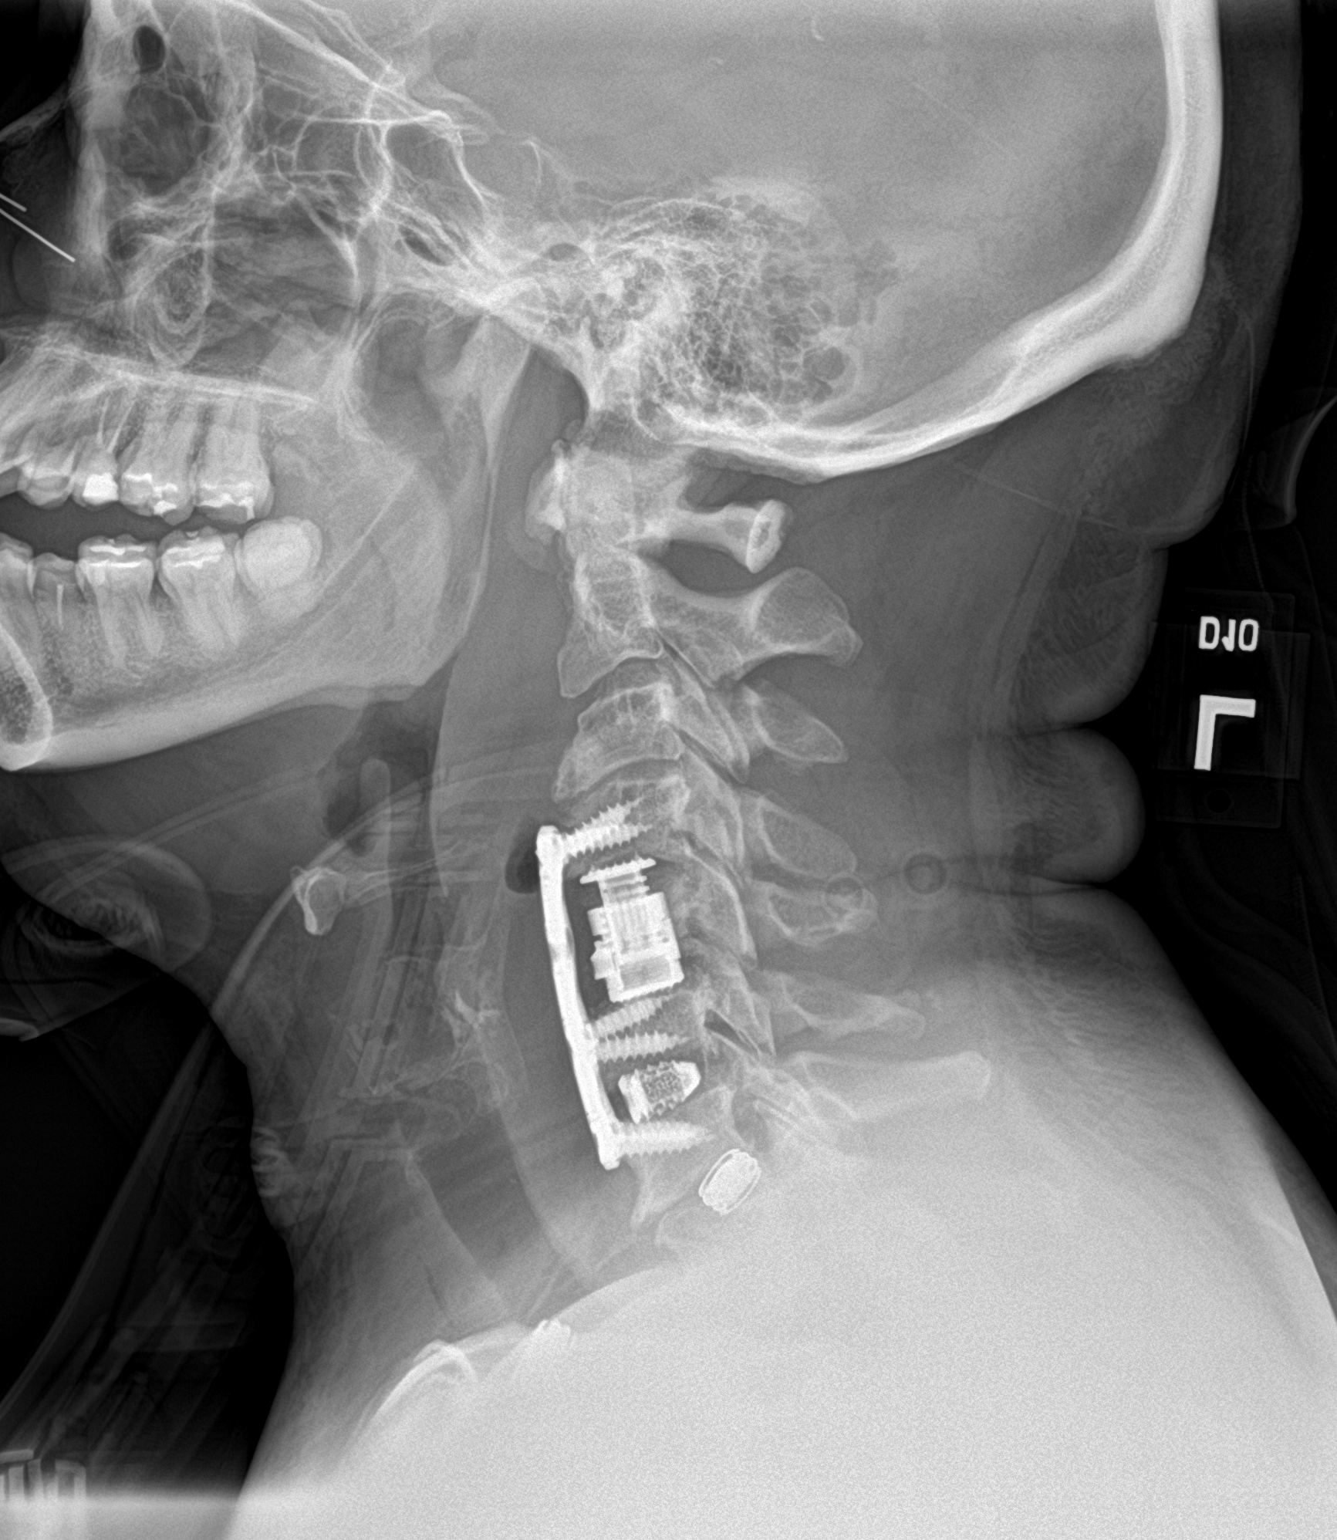

[c-spine ap]
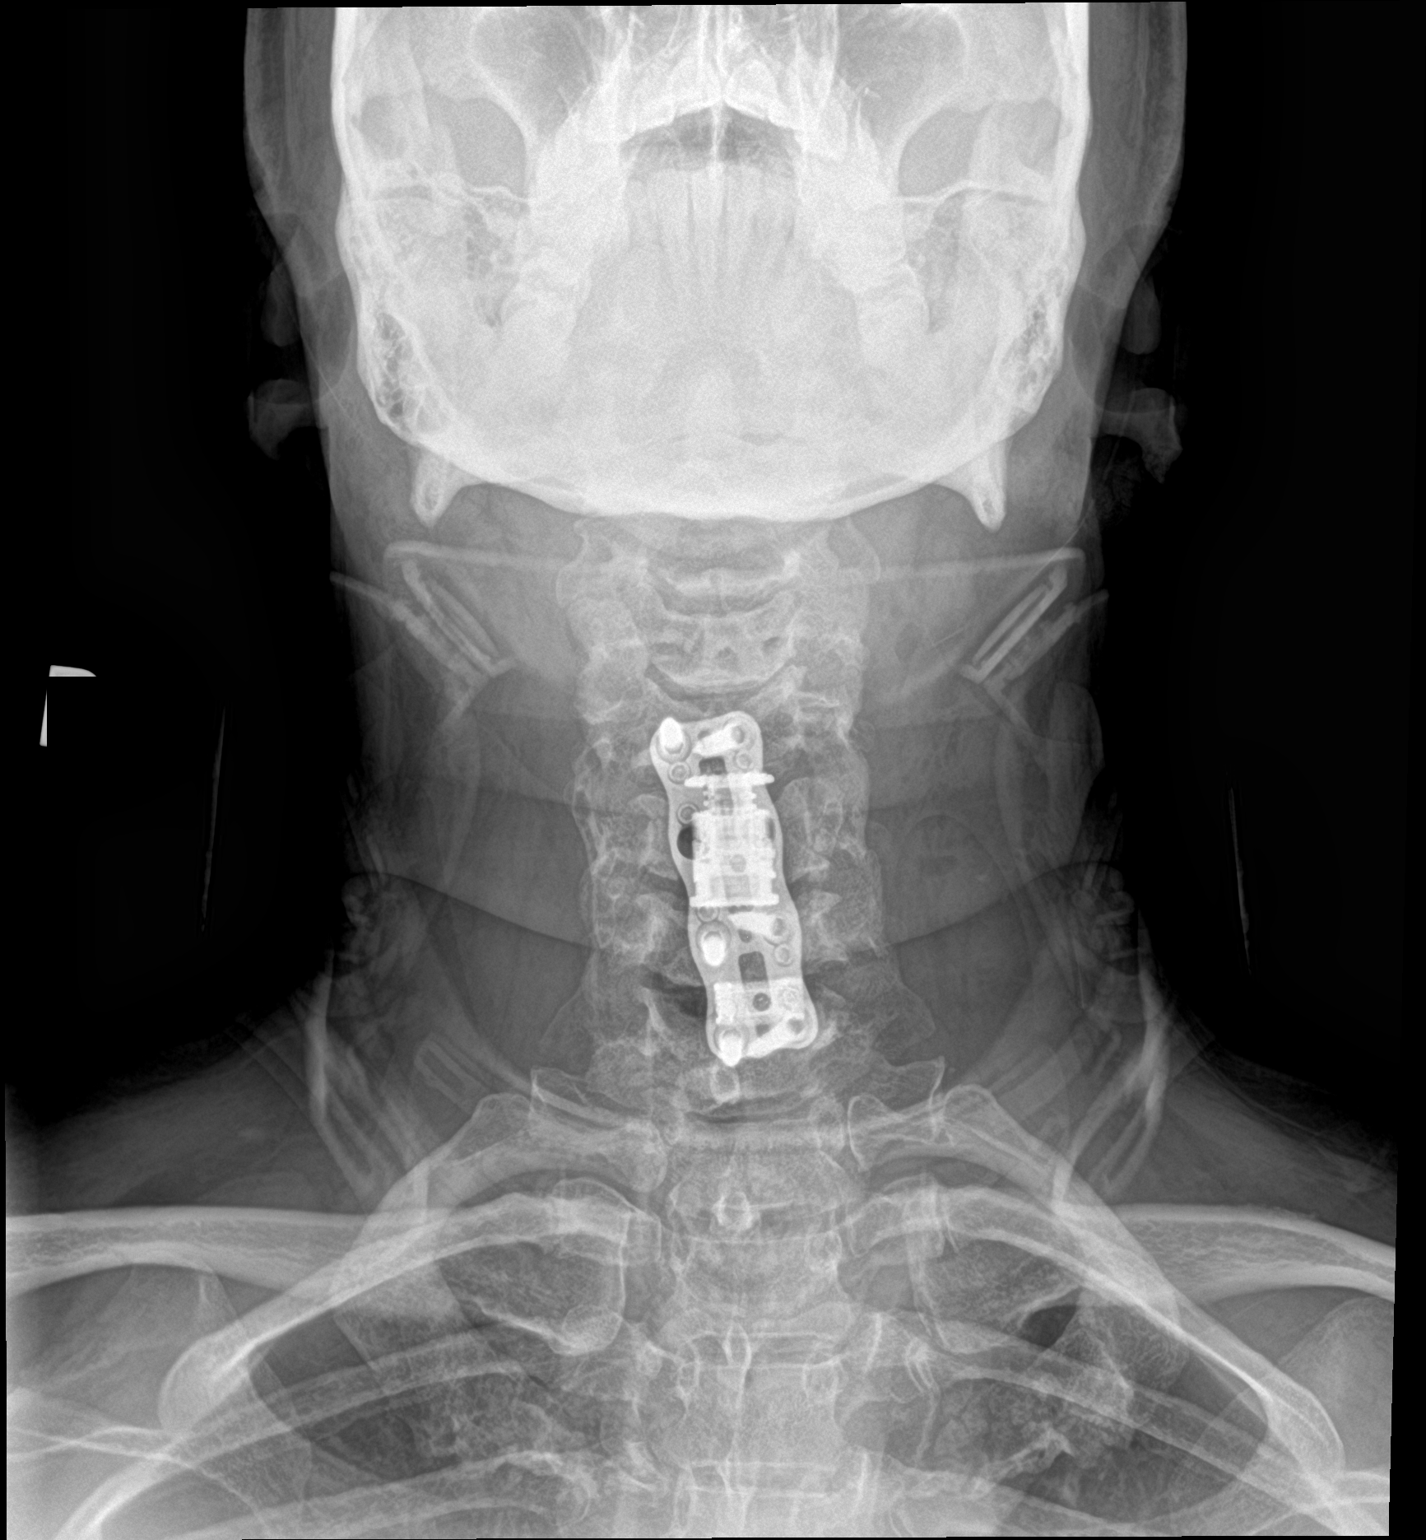

[2 of 2 positions shown; findings below may reference images not displayed]

FINDINGS: Postoperative changes with anterior fusion from C4 to C7. There is
plate and screw fixation, corpectomy at C5 with interbody device
placement, and additional interbody fusion device at C6-C7. There is
expected preoperative soft tissue swelling and air.
IMPRESSION: Post anterior fusion at C4-C7 with C5 corpectomy.

## 2020-07-04 ENCOUNTER — Encounter (HOSPITAL_COMMUNITY): Payer: Self-pay

## 2020-07-04 ENCOUNTER — Other Ambulatory Visit: Payer: Self-pay

## 2020-07-04 ENCOUNTER — Ambulatory Visit (HOSPITAL_COMMUNITY)
Admission: EM | Admit: 2020-07-04 | Discharge: 2020-07-04 | Disposition: A | Discharge status: Home / Self Care | Payer: 59 | Attending: Student | Admitting: Student

## 2020-07-04 ENCOUNTER — Ambulatory Visit (INDEPENDENT_AMBULATORY_CARE_PROVIDER_SITE_OTHER): Payer: 59

## 2020-07-04 DIAGNOSIS — M79661 Pain in right lower leg: Secondary | ICD-10-CM | POA: Diagnosis not present

## 2020-07-04 DIAGNOSIS — W19XXXA Unspecified fall, initial encounter: Secondary | ICD-10-CM | POA: Diagnosis not present

## 2020-07-04 DIAGNOSIS — N898 Other specified noninflammatory disorders of vagina: Secondary | ICD-10-CM

## 2020-07-04 LAB — HIV ANTIBODY (ROUTINE TESTING W REFLEX): HIV Screen 4th Generation wRfx: NONREACTIVE

## 2020-07-04 NOTE — ED Provider Notes (Addendum)
[place]    CSN: [id number] Arrival date & time: 07/04/20  1003      History   Chief Complaint Chief Complaint  Patient presents with  . SEXUALLY TRANSMITTED DISEASE  . Vaginal Discharge  . Vaginal Itching  . Leg Injury    HPI Beverly Hughes is a 54 y.o. female presenting for a fall and female issues.  -Fall: states that 3 days ago she fell and hit her shins on a desk. Since then, her right shin has had a small painful bump. no issues walking on the leg. History of lower back pain which has been aggravated due to catching herself with her hands and arms. Denies hitting knees, head, other major joints. Wishing to make sure nothing is hurt internally. Denies weakness or numbness or tingling in arms/legs. No history of right ankle or lower leg injury in the past. -Vaginal discharge: pt with new partner and unprotected intercourse. White fishy smelling vaginal discharge and external vaginal irritation/itching for 2 weeks, unchanged. Tried monistat for 1 week without improvement. Denies fevers/chills, itching, lesions. Denies hematuria, dysuria, frequency, urgency, back pain, n/v/d/abd pain, fevers/chills. No lesions. History of chlamydia many years ago. Denies sore throat. States she just finished her period and she is not pregnant as her tubes have been tied for years.   HPI  Past Medical History:  Diagnosis Date  . Allergy   . Mixed hyperlipidemia   . Nut allergy    [place] nut    Patient Active Problem List   Diagnosis Date Noted  . S/P cervical spinal fusion 10/15/2019  . Spinal stenosis of cervical region 03/05/2019  . Cervical cord myelomalacia ([place]) 01/14/2019    Past Surgical History:  Procedure Laterality Date  . ANTERIOR CERVICAL CORPECTOMY N/A 10/15/2019   Procedure: ANTERIOR CERVICAL CORPECTOMY C5;  Surgeon: Meade Maw, MD;  Location: [facility] ORS;  Service: Neurosurgery;  Laterality: N/A;  . ANTERIOR CERVICAL DECOMP/DISCECTOMY FUSION N/A  10/15/2019   Procedure: C6-7 ANTERIOR CERVICAL DISCECTOMY AND FUSION, C4-7 ANTERIOR SPINAL INSTRUMENTATION;  Surgeon: Meade Maw, MD;  Location: [facility] ORS;  Service: Neurosurgery;  Laterality: N/A;  . EYE SURGERY     tear duct repair  . FRACTURE SURGERY     left foot fracture at 54 yrs old  . TUBAL LIGATION      OB History   No obstetric history on file.      Home Medications    Prior to Admission medications   Medication Sig Start Date End Date Taking? Authorizing Provider  Calcium-Magnesium-Zinc (CALCIUM-MAGNESUIUM-ZINC PO) Take 1 tablet by mouth in the morning, at noon, and at bedtime.    [provider]  celecoxib (CELEBREX) 100 MG capsule Take 1 capsule (100 mg total) by mouth 2 (two) times daily. 10/17/19   Marin Olp, PA-C  methocarbamol (ROBAXIN) 500 MG tablet Take 1 tablet (500 mg total) by mouth 4 (four) times daily. 10/17/19   Marin Olp, PA-C  ondansetron (ZOFRAN) 4 MG tablet Take 1 tablet (4 mg total) by mouth every 6 (six) hours as needed for nausea or vomiting. 10/17/19   Marin Olp, PA-C  senna (SENOKOT) 8.6 MG TABS tablet Take 1 tablet (8.6 mg total) by mouth 2 (two) times daily. 10/17/19   Marin Olp, PA-C    Family History Family History  Problem Relation Age of Onset  . Diabetes Mother   . Kidney Stones Mother   . Heart disease Mother   . Cancer Father   . Prostate cancer Father   .  Diabetes Maternal Grandmother   . Heart disease Maternal Grandmother   . Hypertension Maternal Grandmother   . Diabetes Maternal Grandfather   . Heart disease Maternal Grandfather   . Hypertension Maternal Grandfather   . Colon cancer Paternal Grandmother   . Hypertension Paternal Grandmother     Social History Social History   Tobacco Use  . Smoking status: Never Smoker  . Smokeless tobacco: Never Used  Vaping Use  . Vaping Use: Never used  Substance Use Topics  . Alcohol use: Yes    Alcohol/week: 2.0 standard drinks    Types: 1 Cans of beer, 1  Glasses of wine per week    Comment: socially  . Drug use: No     Allergies   Other and Codeine sulfate   Review of Systems Review of Systems  Constitutional: Negative for chills and fever.  Genitourinary: Positive for vaginal discharge.  Musculoskeletal:       Right shin pain  All other systems reviewed and are negative.    Physical Exam Triage Vital Signs ED Triage Vitals  Enc Vitals Group     BP      Pulse      Resp      Temp      Temp src      SpO2      Weight      Height      Head Circumference      Peak Flow      Pain Score      Pain Loc      Pain Edu?      Excl. in [place]?    No data found.  Updated Vital Signs BP (!) 159/79 (BP Location: Right Arm)   Pulse 100   Temp 98.1 F (36.7 C) (Oral)   Resp 18   LMP 06/28/2020 (Approximate)   SpO2 99%   Visual Acuity Right Eye Distance:   Left Eye Distance:   Bilateral Distance:    Right Eye Near:   Left Eye Near:    Bilateral Near:     Physical Exam Vitals reviewed.  Constitutional:      General: She is not in acute distress.    Appearance: Normal appearance. She is not ill-appearing.  HENT:     Head: Normocephalic and atraumatic.  Cardiovascular:     Rate and Rhythm: Normal rate and regular rhythm.     Heart sounds: Normal heart sounds.  Pulmonary:     Effort: Pulmonary effort is normal.     Breath sounds: Normal breath sounds.  Abdominal:     General: Bowel sounds are normal. There is no distension.     Palpations: Abdomen is soft. There is no mass.     Tenderness: There is no abdominal tenderness. There is no right CVA tenderness, left CVA tenderness, guarding or rebound. Negative signs include Murphy's sign, Rovsing's sign and McBurney's sign.     Comments: No suprapubic tenderness to palpation.   Musculoskeletal:     Comments: All major joints without deformity, ecchymosis, tenderness.   Right ankle with no tenderness along fibula, ankle, medial/lateral malleolus. No bony deformity. Pt  bears weight with no discomfort.   Lumbar paraspinous muscle tenderness to deep palpation. No bony tenderness or deformity. Negative straight leg raise.  Neurological:     General: No focal deficit present.     Mental Status: She is alert and oriented to person, place, and time. Mental status is at baseline.     Cranial Nerves:  Cranial nerves are intact.     Sensory: Sensation is intact.     Motor: No weakness.     Coordination: Coordination is intact.     Gait: Gait is intact.     Comments: CN 2-12 grossly intact. Sensation and strength intact in legs, arms.   Psychiatric:        Mood and Affect: Mood normal.        Behavior: Behavior normal.        Thought Content: Thought content normal.        Judgment: Judgment normal.      UC Treatments / Results  Labs (all labs ordered are listed, but only abnormal results are displayed) Labs Reviewed  HIV ANTIBODY (ROUTINE TESTING W REFLEX)  RPR  CERVICOVAGINAL ANCILLARY ONLY    EKG   Radiology DG Tibia/Fibula Right  Result Date: 07/04/2020 CLINICAL DATA:  Pain after fall. EXAM: RIGHT TIBIA AND FIBULA - 2 VIEW COMPARISON:  None. FINDINGS: Soft tissue calcification adjacent to the distal medial malleolus. Possible step-off in the posterior fibula on the lateral view. IMPRESSION: 1. Possible subtle fracture in the posterior fibula on the lateral view only. Dedicated images of the ankle may better evaluate if there is focal pain in this region. There is no soft tissue swelling seen in this region. 2. Soft tissue calcification adjacent to the distal medial malleolus may represent sequela of an age-indeterminate avulsion injury. There is no soft tissue swelling in this region. Again, dedicated images of the ankle may better evaluate. Electronically Signed   By: Dorise Bullion III M.D   On: 07/04/2020 12:26    Procedures Procedures (including critical care time)  Medications Ordered in UC Medications - No data to display  Initial  Impression / Assessment and Plan / UC Course  I have reviewed the triage vital signs and the nursing notes.  Pertinent labs & imaging results that were available during my care of the patient were reviewed by me and considered in my medical decision making (see chart for details).     Xray today showing:  IMPRESSION: 1. Possible subtle fracture in the posterior fibula on the lateral view only. Dedicated images of the ankle may better evaluate if there is focal pain in this region. There is no soft tissue swelling seen in this region. 2. Soft tissue calcification adjacent to the distal medial malleolus may represent sequela of an age-indeterminate avulsion injury. There is no soft tissue swelling in this region. Again, dedicated images of the ankle may better evaluate.  There is no tenderness, swelling, or deformity along the posterior fibula or medial malleolus. Reassurance provided. Ibuprofen/tylenol for right shin pain.  Labs sent: gonorrhea, chlamydia, trich, BV, yeast, Syphillis, HIV.  Denies pain shooting down legs, denies numbness in arms/legs, denies weakness in arms/legs, denies saddle anesthesia, denies bowel/bladder incontinence. Return precautions- new/worsening fevers/chills, abd pain, vaginal discharge, vaginal irritation, back pain, chest pain, shortness of breath.  She just finished her period and her tubes have been tied. Pregnancy test declined today.  PCP information provided and stressed importance of establishing care with PCP for routine health maintenance.  Final Clinical Impressions(s) / UC Diagnoses   Final diagnoses:  Vaginal discharge  Fall, initial encounter  Pain in right shin     Discharge Instructions     We will call you if the results of any of your labwork is positive.   I provided information about a primary care practice you can establish care with.   Seek  additional medical attention if - new/worsening symptoms including: fevers/chills, shortness  of breath, chest pain, abd pain, etc.     ED Prescriptions    None     PDMP not reviewed this encounter.   Hazel Sams, PA-C 07/04/20 1301    Hazel Sams, PA-C 07/04/20 1301

## 2020-07-04 NOTE — Discharge Instructions (Addendum)
We will call you if the results of any of your labwork is positive.   I provided information about a primary care practice you can establish care with.   Seek additional medical attention if - new/worsening symptoms including: fevers/chills, shortness of breath, chest pain, abd pain, etc.

## 2020-07-04 NOTE — ED Triage Notes (Signed)
Pt c/o white vaginal discharge and itching. Pt states she had a new partner. Pt states she noticed a foul odor. Pt denies dysuria and frequency. She states she would like to be tested for an STI.   Pt states she fell on Thursday and hit a desk on the floor. She states she hit both of her knees and states she feels tender below her knees.

## 2020-07-05 LAB — CERVICOVAGINAL ANCILLARY ONLY
Bacterial Vaginitis (gardnerella): POSITIVE — AB
Candida Glabrata: NEGATIVE
Candida Vaginitis: NEGATIVE
Chlamydia: NEGATIVE
Comment: NEGATIVE
Comment: NEGATIVE
Comment: NEGATIVE
Comment: NEGATIVE
Comment: NEGATIVE
Comment: NORMAL
Neisseria Gonorrhea: NEGATIVE
Trichomonas: NEGATIVE

## 2020-07-05 LAB — RPR: RPR Ser Ql: NONREACTIVE

## 2020-07-06 ENCOUNTER — Telehealth (HOSPITAL_COMMUNITY): Payer: Self-pay | Admitting: Emergency Medicine

## 2020-07-06 MED ORDER — METRONIDAZOLE 500 MG PO TABS: 500.0000 mg | ORAL_TABLET | Freq: Two times a day (BID) | ORAL | 0 refills | Status: DC

## 2020-07-19 ENCOUNTER — Encounter (HOSPITAL_COMMUNITY): Payer: Self-pay | Admitting: Emergency Medicine

## 2020-07-19 ENCOUNTER — Other Ambulatory Visit: Payer: Self-pay

## 2020-07-19 DIAGNOSIS — Z5321 Procedure and treatment not carried out due to patient leaving prior to being seen by health care provider: Secondary | ICD-10-CM | POA: Insufficient documentation

## 2020-07-19 DIAGNOSIS — R079 Chest pain, unspecified: Secondary | ICD-10-CM | POA: Diagnosis present

## 2020-07-19 DIAGNOSIS — U071 COVID-19: Secondary | ICD-10-CM | POA: Diagnosis not present

## 2020-07-19 LAB — CBC
HCT: 41.8 % (ref 36.0–46.0)
Hemoglobin: 13.6 g/dL (ref 12.0–15.0)
MCH: 29.4 pg (ref 26.0–34.0)
MCHC: 32.5 g/dL (ref 30.0–36.0)
MCV: 90.3 fL (ref 80.0–100.0)
Platelets: 265 10*3/uL (ref 150–400)
RBC: 4.63 MIL/uL (ref 3.87–5.11)
RDW: 12.9 % (ref 11.5–15.5)
WBC: 5 10*3/uL (ref 4.0–10.5)
nRBC: 0 % (ref 0.0–0.2)

## 2020-07-19 LAB — BASIC METABOLIC PANEL WITH GFR
Anion gap: 9 (ref 5–15)
BUN: 10 mg/dL (ref 6–20)
CO2: 27 mmol/L (ref 22–32)
Calcium: 9.1 mg/dL (ref 8.9–10.3)
Chloride: 103 mmol/L (ref 98–111)
Creatinine, Ser: 0.6 mg/dL (ref 0.44–1.00)
GFR, Estimated: >60 mL/min (ref >60)
Glucose, Bld: 97 mg/dL (ref 70–99)
Potassium: 4.2 mmol/L (ref 3.5–5.1)
Sodium: 139 mmol/L (ref 135–145)

## 2020-07-19 LAB — I-STAT BETA HCG BLOOD, ED (MC, WL, AP ONLY): I-stat hCG, quantitative: <5 m[IU]/mL (ref <5)

## 2020-07-19 LAB — TROPONIN I (HIGH SENSITIVITY): Troponin I (High Sensitivity): <2 ng/L (ref <18)

## 2020-07-19 NOTE — ED Triage Notes (Addendum)
Patient c/o central chest pain with cough since yesterday. Patient adds she had positive Covid test today.

## 2020-07-20 ENCOUNTER — Emergency Department (HOSPITAL_COMMUNITY)
Admission: EM | Admit: 2020-07-20 | Discharge: 2020-07-20 | Disposition: A | Discharge status: Home / Self Care | Payer: 59 | Attending: Emergency Medicine | Admitting: Emergency Medicine

## 2020-07-20 ENCOUNTER — Encounter (HOSPITAL_COMMUNITY): Payer: Self-pay | Admitting: Emergency Medicine

## 2020-07-20 ENCOUNTER — Other Ambulatory Visit: Payer: Self-pay

## 2020-07-20 ENCOUNTER — Ambulatory Visit (INDEPENDENT_AMBULATORY_CARE_PROVIDER_SITE_OTHER)
Admission: EM | Admit: 2020-07-20 | Discharge: 2020-07-20 | Disposition: A | Discharge status: Home / Self Care | Payer: 59 | Source: Home / Self Care

## 2020-07-20 DIAGNOSIS — M940 Chondrocostal junction syndrome [Tietze]: Secondary | ICD-10-CM

## 2020-07-20 DIAGNOSIS — U071 COVID-19: Secondary | ICD-10-CM | POA: Diagnosis not present

## 2020-07-20 LAB — RESP PANEL BY RT-PCR (FLU A&B, COVID) ARPGX2
Influenza A by PCR: NEGATIVE
Influenza B by PCR: NEGATIVE
SARS Coronavirus 2 by RT PCR: POSITIVE — AB

## 2020-07-20 NOTE — ED Triage Notes (Addendum)
Pt presents with cough, fever, body aches, and chest pain xs 4 days. States had back surgery 10 months ago and is still healing from surgery.   Pt was on phone with insurance company while trying to complete triage. Appeared to be in no distress.  Was seen at [facility] ED, EKG completed and blood work. Advise [facility] ED that she had positive COVID test.

## 2020-07-20 NOTE — ED Provider Notes (Signed)
[place]    CSN: [id number] Arrival date & time: 07/20/20  0830      History   Chief Complaint Chief Complaint  Patient presents with  . Chest Pain  . Fever  . Cough    HPI Beverly Hughes is a 55 y.o. female Presenting for URI symptoms for 4 days. History of allergies, hyperlipidemia, s/p cervical spinal fusion. Presenting today with cough, fevers, body aches, chest pain x4 days. She was seen at [facility] ED  Was seen at [facility] ED, EKG not completed but blood work completed, including troponin which was normal. Advised in [place] ED that she had positive COVID test. she however left the ED before EKG was performed due to long wait time. Again complaining today of constant sharp chest pain in center and left side of chest. Also complaining of left shoulder/arm pain that is worse with movement of arm, and pain in middle of back that is worse with laying flat or on her side. Has been taking ibuprofen at home. Not able to check temperature at home. Normal bowel movements, no nausea/diarrhea. No history of cardiac disease.  Also states she had back surgery 10 months ago and is still healing from this.  Denies fevers, n/v/d, shortness of breath,  Swelling in extremities, facial pain, teeth pain, headaches, loss of taste/smell, swollen lymph nodes, ear pain.  Deniesshortness of breath, confusion, high fevers.   HPI  Past Medical History:  Diagnosis Date  . Allergy   . Mixed hyperlipidemia   . Nut allergy    [place] nut    Patient Active Problem List   Diagnosis Date Noted  . S/P cervical spinal fusion 10/15/2019  . Spinal stenosis of cervical region 03/05/2019  . Cervical cord myelomalacia ([place]) 01/14/2019    Past Surgical History:  Procedure Laterality Date  . ANTERIOR CERVICAL CORPECTOMY N/A 10/15/2019   Procedure: ANTERIOR CERVICAL CORPECTOMY C5;  Surgeon: Meade Maw, MD;  Location: [facility] ORS;  Service: Neurosurgery;  Laterality: N/A;  . ANTERIOR CERVICAL  DECOMP/DISCECTOMY FUSION N/A 10/15/2019   Procedure: C6-7 ANTERIOR CERVICAL DISCECTOMY AND FUSION, C4-7 ANTERIOR SPINAL INSTRUMENTATION;  Surgeon: Meade Maw, MD;  Location: [facility] ORS;  Service: Neurosurgery;  Laterality: N/A;  . EYE SURGERY     tear duct repair  . FRACTURE SURGERY     left foot fracture at 55 yrs old  . TUBAL LIGATION      OB History   No obstetric history on file.      Home Medications    Prior to Admission medications   Medication Sig Start Date End Date Taking? Authorizing Provider  Calcium-Magnesium-Zinc (CALCIUM-MAGNESUIUM-ZINC PO) Take 1 tablet by mouth in the morning, at noon, and at bedtime.    [provider]  celecoxib (CELEBREX) 100 MG capsule Take 1 capsule (100 mg total) by mouth 2 (two) times daily. 10/17/19   Marin Olp, PA-C  methocarbamol (ROBAXIN) 500 MG tablet Take 1 tablet (500 mg total) by mouth 4 (four) times daily. 10/17/19   Marin Olp, PA-C  metroNIDAZOLE (FLAGYL) 500 MG tablet Take 1 tablet (500 mg total) by mouth 2 (two) times daily. 07/06/20   Lamptey, Myrene Galas, MD  ondansetron (ZOFRAN) 4 MG tablet Take 1 tablet (4 mg total) by mouth every 6 (six) hours as needed for nausea or vomiting. 10/17/19   Marin Olp, PA-C  senna (SENOKOT) 8.6 MG TABS tablet Take 1 tablet (8.6 mg total) by mouth 2 (two) times daily. 10/17/19   Marin Olp, PA-C  Family History Family History  Problem Relation Age of Onset  . Diabetes Mother   . Kidney Stones Mother   . Heart disease Mother   . Cancer Father   . Prostate cancer Father   . Diabetes Maternal Grandmother   . Heart disease Maternal Grandmother   . Hypertension Maternal Grandmother   . Diabetes Maternal Grandfather   . Heart disease Maternal Grandfather   . Hypertension Maternal Grandfather   . Colon cancer Paternal Grandmother   . Hypertension Paternal Grandmother     Social History Social History   Tobacco Use  . Smoking status: Never Smoker  . Smokeless tobacco:  Never Used  Vaping Use  . Vaping Use: Never used  Substance Use Topics  . Alcohol use: Yes    Alcohol/week: 2.0 standard drinks    Types: 1 Cans of beer, 1 Glasses of wine per week    Comment: socially  . Drug use: No     Allergies   Other and Codeine sulfate   Review of Systems Review of Systems  Constitutional: Positive for chills. Negative for appetite change and fever.  HENT: Positive for congestion. Negative for ear pain, rhinorrhea, sinus pressure, sinus pain and sore throat.   Eyes: Negative for redness and visual disturbance.  Respiratory: Positive for cough. Negative for chest tightness, shortness of breath and wheezing.   Cardiovascular: Negative for chest pain and palpitations.  Gastrointestinal: Negative for abdominal pain, constipation, diarrhea, nausea and vomiting.  Genitourinary: Negative for dysuria, frequency and urgency.  Musculoskeletal: Positive for myalgias.       Chest wall pain  Neurological: Negative for dizziness, weakness and headaches.  Psychiatric/Behavioral: Negative for confusion.  All other systems reviewed and are negative.    Physical Exam Triage Vital Signs ED Triage Vitals  Enc Vitals Group     BP      Pulse      Resp      Temp      Temp src      SpO2      Weight      Height      Head Circumference      Peak Flow      Pain Score      Pain Loc      Pain Edu?      Excl. in [place]?    No data found.  Updated Vital Signs BP 138/87 (BP Location: Left Arm) Comment: large cuff  Pulse 78   Temp 98.2 F (36.8 C) (Oral)   Resp 18   LMP 06/28/2020 (Approximate)   SpO2 98%   Visual Acuity Right Eye Distance:   Left Eye Distance:   Bilateral Distance:    Right Eye Near:   Left Eye Near:    Bilateral Near:     Physical Exam Vitals reviewed.  Constitutional:      General: She is not in acute distress.    Appearance: Normal appearance. She is not ill-appearing.  HENT:     Head: Normocephalic and atraumatic.     Right Ear:  Hearing, tympanic membrane, ear canal and external ear normal. No swelling or tenderness. There is no impacted cerumen. No mastoid tenderness. Tympanic membrane is not perforated, erythematous, retracted or bulging.     Left Ear: Hearing, tympanic membrane, ear canal and external ear normal. No swelling or tenderness. There is no impacted cerumen. No mastoid tenderness. Tympanic membrane is not perforated, erythematous, retracted or bulging.     Nose:  Right Sinus: No maxillary sinus tenderness or frontal sinus tenderness.     Left Sinus: No maxillary sinus tenderness or frontal sinus tenderness.     Mouth/Throat:     Mouth: Mucous membranes are moist.     Pharynx: Uvula midline. No oropharyngeal exudate or posterior oropharyngeal erythema.     Tonsils: No tonsillar exudate.  Cardiovascular:     Rate and Rhythm: Normal rate and regular rhythm.     Heart sounds: Normal heart sounds.     Comments: No calf tenderness Pulmonary:     Breath sounds: Normal breath sounds and air entry. No wheezing, rhonchi or rales.  Chest:     Chest wall: No tenderness.  Abdominal:     General: Abdomen is flat. Bowel sounds are normal.     Tenderness: There is no abdominal tenderness. There is no guarding or rebound.  Musculoskeletal:     Cervical back: Normal and normal range of motion.     Thoracic back: Tenderness present.     Lumbar back: Normal.     Right lower leg: No tenderness. No edema.     Left lower leg: No tenderness. No edema.     Comments: -Thoracic paraspinous muscle tenderness with deep palpation -L shoulder pain radiating from shoulder to elbow with abduction. No point tenderness to palpation, no bony deformity.   Lymphadenopathy:     Cervical: No cervical adenopathy.  Neurological:     General: No focal deficit present.     Mental Status: She is alert and oriented to person, place, and time.  Psychiatric:        Attention and Perception: Attention and perception normal.        Mood  and Affect: Mood and affect normal.        Behavior: Behavior normal. Behavior is cooperative.        Thought Content: Thought content normal.        Judgment: Judgment normal.      UC Treatments / Results  Labs (all labs ordered are listed, but only abnormal results are displayed) Labs Reviewed - No data to display  EKG   Radiology No results found.  Procedures Procedures (including critical care time)  Medications Ordered in UC Medications - No data to display  Initial Impression / Assessment and Plan / UC Course  I have reviewed the triage vital signs and the nursing notes.  Pertinent labs & imaging results that were available during my care of the patient were reviewed by me and considered in my medical decision making (see chart for details).     [facility] day [facility]. Labs collected at that time: CBC, BMP, troponin, hcg blood. All wnl. EKG today NSR, unchanged compared with 09/2019 EKG. Reassurance provided.  HEART score (using troponin drawn 12 hours ago): History +1 EKG: NSR 0 Age +1 Risk factors: +1 (BMI, hyperlipidemia) Initial troponin: 0 --> 3 (low score)  Covid positive.  Isolation precautions per CDC guidelines. Symptomatic relief with OTC Mucinex, Nyquil, etc. Return precautions- new/worsening fevers/chills, shortness of breath, chest pain, abd pain, etc. Seek additional medical attention new/worsening fevers; if chest pain changes in intensity or quality; new/worsening abd pain; shortness of breath; etc. if Pt understands and is in agreement with treatment plan.    Final Clinical Impressions(s) / UC Diagnoses   Final diagnoses:  COVID-19  Costochondritis     Discharge Instructions     -Your EKG was normal today -For  fevers/chills, body aches, headaches- use Tylenol and Ibuprofen. You can alternate these for maximum effect. Use up to 3000mg  Tylenol daily and 3200mg  Ibuprofen daily. Make sure to take ibuprofen  with food. Check the bottle of ibuprofen/tylenol for specific dosage instructions.  We are currently awaiting result of your PCR covid-19 test. Please isolate at home while awaiting these results. If your test is positive for Covid-19, continue to isolate at home for 5 days if you have mild symptoms, or a total of 10 days from symptom onset if you have more severe symptoms. If you quarantine for a shorter period of time (i.e. 5 days), make sure to wear a mask until day 10 of symptoms. Treat your symptoms at home with OTC remedies like tylenol/ibuprofen, mucinex, nyquil, etc. Seek medical attention if you develop high fevers, chest pain, shortness of breath, ear pain, facial pain, etc. Make sure to get up and move around every 2-3 hours while convalescing to help prevent blood clots. Drink plenty of fluids, and rest as much as possible.     ED Prescriptions    None     PDMP not reviewed this encounter.   Hazel Sams, PA-C 07/20/20 1143

## 2020-07-20 NOTE — Discharge Instructions (Addendum)
-  Your EKG was normal today -For fevers/chills, body aches, headaches- use Tylenol and Ibuprofen. You can alternate these for maximum effect. Use up to 3000mg  Tylenol daily and 3200mg  Ibuprofen daily. Make sure to take ibuprofen with food. Check the bottle of ibuprofen/tylenol for specific dosage instructions.  We are currently awaiting result of your PCR covid-19 test. Please isolate at home while awaiting these results. If your test is positive for Covid-19, continue to isolate at home for 5 days if you have mild symptoms, or a total of 10 days from symptom onset if you have more severe symptoms. If you quarantine for a shorter period of time (i.e. 5 days), make sure to wear a mask until day 10 of symptoms. Treat your symptoms at home with OTC remedies like tylenol/ibuprofen, mucinex, nyquil, etc. Seek medical attention if you develop high fevers, chest pain, shortness of breath, ear pain, facial pain, etc. Make sure to get up and move around every 2-3 hours while convalescing to help prevent blood clots. Drink plenty of fluids, and rest as much as possible.

## 2020-08-20 ENCOUNTER — Ambulatory Visit (INDEPENDENT_AMBULATORY_CARE_PROVIDER_SITE_OTHER): Payer: 59 | Admitting: Family

## 2020-08-20 ENCOUNTER — Other Ambulatory Visit: Payer: Self-pay

## 2020-08-20 VITALS — BP 142/83 | HR 65 | Ht 64.65 in | Wt 201.0 lb

## 2020-08-20 DIAGNOSIS — Z7689 Persons encountering health services in other specified circumstances: Secondary | ICD-10-CM

## 2020-08-20 DIAGNOSIS — Z1329 Encounter for screening for other suspected endocrine disorder: Secondary | ICD-10-CM | POA: Diagnosis not present

## 2020-08-20 DIAGNOSIS — Z1211 Encounter for screening for malignant neoplasm of colon: Secondary | ICD-10-CM

## 2020-08-20 DIAGNOSIS — I10 Essential (primary) hypertension: Secondary | ICD-10-CM | POA: Insufficient documentation

## 2020-08-20 DIAGNOSIS — Z1231 Encounter for screening mammogram for malignant neoplasm of breast: Secondary | ICD-10-CM

## 2020-08-20 DIAGNOSIS — B351 Tinea unguium: Secondary | ICD-10-CM | POA: Diagnosis not present

## 2020-08-20 NOTE — Progress Notes (Signed)
Subjective:    Beverly Hughes - 55 y.o. female MRN [id number]  Date of birth: 03-06-1966  HPI  Beverly Hughes presents today with concerns of thyroid labs, feet fungus, colonoscopy request, and breast pain. Patient has a PMH significant for cervical cord myelomalacia, spinal stenosis of cervical region, and s/p cervical spinal fusion.   1. HYPOTHYROIDISM: Reports has never taken thyroid medication. States she was told that her thyroid levels were high around July 2021 from previous primary provider. She is not sure of what the exact labs were and does not have documentation available today for review.  Fatigue: no Cold intolerance: yes Heat intolerance: yes, may be related to hot flashes  Weight gain: no Weight loss: no Constipation: no Diarrhea/loose stools: no Palpitations: sometimes  Lower extremity edema: no  2. PODIATRIST REQUEST: Reports concerns for fungus of toenails of both feet. Left foot worse. Would like referral to Podiatry to try KeryFlex treatment.   3. COLONOSCOPY REQUEST: Colonoscopy due.   4. BREAST PAIN: Duration :1 year  Location: bilateral Onset: gradual  Severity: 3-4/10 Quality: sharp, tender, burning, stabbing Frequency: intermittent Redness: noticed it one time  Swelling: sometimes on bilateral sides especially during menstruation  Trauma: no trauma Associated with menstral cycle: worse with menstruation  Nipple discharge: no  Breast lump: yes, left worse than right Status: stable Treatments attempted: Tylenol sometimes  Previous mammogram: yes, December 16, 2018 Reports her current Editor, commissioning is Dr. Mancel Bale at [facility]. Would prefer to return there for her mammogram.  ROS per HPI   Health Maintenance:  Health Maintenance Due  Topic Date Due  . COVID-19 Vaccine (1) Never done  . TETANUS/TDAP  Never done  . COLONOSCOPY (Pts 45-6yrs Insurance coverage will need to be confirmed)  Never done  . INFLUENZA VACCINE  Never done      Past Medical History: Patient Active Problem List   Diagnosis Date Noted  . Hypertensive disorder 08/20/2020  . S/P cervical spinal fusion 10/15/2019  . Degenerative disc disease, cervical 05/01/2019  . Spinal stenosis of cervical region 03/05/2019  . Cervical cord myelomalacia ([place]) 01/14/2019    Social History   reports that she has never smoked. She has never used smokeless tobacco. She reports current alcohol use of about 2.0 standard drinks of alcohol per week. She reports that she does not use drugs.   Family History  family history includes Cancer in her father; Colon cancer in her paternal grandmother; Diabetes in her maternal grandfather, maternal grandmother, and mother; Heart disease in her maternal grandfather, maternal grandmother, and mother; Hypertension in her maternal grandfather, maternal grandmother, and paternal grandmother; Kidney Stones in her mother; Prostate cancer in her father.   Medications: reviewed and updated   Objective:   Physical Exam BP (!) 142/83 (BP Location: Left Arm, Patient Position: Sitting)   Pulse 65   Ht 5' 4.65" (1.642 m)   Wt 201 lb (91.2 kg)   SpO2 96%   BMI 33.82 kg/m    Physical Exam HENT:     Head: Normocephalic.  Eyes:     Extraocular Movements: Extraocular movements intact.     Pupils: Pupils are equal, round, and reactive to light.  Cardiovascular:     Rate and Rhythm: Normal rate and regular rhythm.     Pulses: Normal pulses.     Heart sounds: Normal heart sounds.  Pulmonary:     Effort: Pulmonary effort is normal.     Breath sounds: Normal breath sounds.  Chest:  Comments: Patient declined examination.  Musculoskeletal:     Cervical back: Normal range of motion and neck supple.  Feet:     Right foot:     Skin integrity: Skin integrity normal.     Toenail Condition: Fungal disease present.    Left foot:     Skin integrity: Skin integrity normal.     Toenail Condition: Fungal disease  present. Neurological:     General: No focal deficit present.     Mental Status: She is alert and oriented to person, place, and time.  Psychiatric:        Mood and Affect: Mood normal.        Behavior: Behavior normal.       RIGHT FOOT    LEFT FOOT   Assessment & Plan:  1. Encounter to establish care: - Patient presents today to establish care.  - Return for annual physical examination, labs, and health maintenance. Arrive fasting meaning having had no food and/or nothing to drink for at least 8 hours prior to appointment.  2. Thyroid disorder screen: - TSH panel to evaluate thyroid function. - TSH+T4F+T3Free  3. Toenail fungus: - Reports concerns for fungus of toenails of both feet. Left foot worse. Would like referral to Podiatry to try KeryFlex treatment.  - Per patient request referral to Podiatry for further evaluation and management.  - Ambulatory referral to Podiatry  4. Colon cancer screening: - Referral to Gastroenterology for colon cancer screening via colonoscopy.  - Ambulatory referral to Gastroenterology  5. Encounter for screening mammogram for malignant neoplasm of breast: - Reports bilateral breast pain for 1 year. Left worse than right.  - Last mammogram 12/16/2018. - Patient requesting to consult with her Gynecologist Dr. Mancel Bale at [facility].   - Per patient request referral to Gynecology for further evaluation and management. - Ambulatory referral to Gynecology   Durene Fruits, NP 08/20/2020, 12:38 PM Primary [facility] at [facility]

## 2020-08-20 NOTE — Patient Instructions (Addendum)
Referral for mammogram.  Referral for colonoscopy.   Referral to Podiatry.   Thyroid labs today. Colonoscopy, Adult A colonoscopy is a procedure to look at the entire large intestine. This procedure is done using a long, thin, flexible tube that has a camera on the end. You may have a colonoscopy:  As a part of normal colorectal screening.  If you have certain symptoms, such as: ? A low number of red blood cells in your blood (anemia). ? Diarrhea that does not go away. ? Pain in your abdomen. ? Blood in your stool. A colonoscopy can help screen for and diagnose medical problems, including:  Tumors.  Extra tissue that grows where mucus forms (polyps).  Inflammation.  Areas of bleeding. Tell your health care provider about:  Any allergies you have.  All medicines you are taking, including vitamins, herbs, eye drops, creams, and over-the-counter medicines.  Any problems you or family members have had with anesthetic medicines.  Any blood disorders you have.  Any surgeries you have had.  Any medical conditions you have.  Any problems you have had with having bowel movements.  Whether you are pregnant or may be pregnant. What are the risks? Generally, this is a safe procedure. However, problems may occur, including:  Bleeding.  Damage to your intestine.  Allergic reactions to medicines given during the procedure.  Infection. This is rare. What happens before the procedure? Eating and drinking restrictions Follow instructions from your health care provider about eating or drinking restrictions, which may include:  A few days before the procedure: ? Follow a low-fiber diet. ? Avoid nuts, seeds, dried fruit, raw fruits, and vegetables.  1-3 days before the procedure: ? Eat only gelatin dessert or ice pops. ? Drink only clear liquids, such as water, clear juice, clear broth or bouillon, black coffee or tea, or clear soft drinks or sports drinks. ? Avoid  liquids that contain red or purple dye.  The day of the procedure: ? Do not eat solid foods. You may continue to drink clear liquids until up to 2 hours before the procedure. ? Do not eat or drink anything starting 2 hours before the procedure, or within the time period that your health care provider recommends. Bowel prep If you were prescribed a bowel prep to take by mouth (orally) to clean out your colon:  Take it as told by your health care provider. Starting the day before your procedure, you will need to drink a large amount of liquid medicine. The liquid will cause you to have many bowel movements of loose stool until your stool becomes almost clear or light green.  If your skin or the opening between the buttocks (anus) gets irritated from diarrhea, you may relieve the irritation using: ? Wipes with medicine in them, such as adult wet wipes with aloe and vitamin E. ? A product to soothe skin, such as petroleum jelly.  If you vomit while drinking the bowel prep: ? Take a break for up to 60 minutes. ? Begin the bowel prep again. ? Call your health care provider if you keep vomiting or you cannot take the bowel prep without vomiting.  To clean out your colon, you may also be given: ? Laxative medicines. These help you have a bowel movement. ? Instructions for enema use. An enema is liquid medicine injected into your rectum. Medicines Ask your health care provider about:  Changing or stopping your regular medicines or supplements. This is especially important if you are  taking iron supplements, diabetes medicines, or blood thinners.  Taking medicines such as aspirin and ibuprofen. These medicines can thin your blood. Do not take these medicines unless your health care provider tells you to take them.  Taking over-the-counter medicines, vitamins, herbs, and supplements. General instructions  Ask your health care provider what steps will be taken to help prevent infection. These may  include washing skin with a germ-killing soap.  Plan to have someone take you home from the hospital or clinic. What happens during the procedure?  An IV will be inserted into one of your veins.  You may be given one or more of the following: ? A medicine to help you relax (sedative). ? A medicine to numb the area (local anesthetic). ? A medicine to make you fall asleep (general anesthetic). This is rarely needed.  You will lie on your side with your knees bent.  The tube will: ? Have oil or gel put on it (be lubricated). ? Be inserted into your anus. ? Be gently eased through all parts of your large intestine.  Air will be sent into your colon to keep it open. This may cause some pressure or cramping.  Images will be taken with the camera and will appear on a screen.  A small tissue sample may be removed to be looked at under a microscope (biopsy). The tissue may be sent to a lab for testing if any signs of problems are found.  If small polyps are found, they may be removed and checked for cancer cells.  When the procedure is finished, the tube will be removed. The procedure may vary among health care providers and hospitals.   What happens after the procedure?  Your blood pressure, heart rate, breathing rate, and blood oxygen level will be monitored until you leave the hospital or clinic.  You may have a small amount of blood in your stool.  You may pass gas and have mild cramping or bloating in your abdomen. This is caused by the air that was used to open your colon during the exam.  Do not drive for 24 hours after the procedure.  It is up to you to get the results of your procedure. Ask your health care provider, or the department that is doing the procedure, when your results will be ready. Summary  A colonoscopy is a procedure to look at the entire large intestine.  Follow instructions from your health care provider about eating and drinking before the  procedure.  If you were prescribed an oral bowel prep to clean out your colon, take it as told by your health care provider.  During the colonoscopy, a flexible tube with a camera on its end is inserted into the anus and then passed into the other parts of the large intestine. This information is not intended to replace advice given to you by your health care provider. Make sure you discuss any questions you have with your health care provider. Document Revised: 01/24/2019 Document Reviewed: 01/24/2019 Elsevier Patient Education  [place].

## 2020-08-20 NOTE — Progress Notes (Signed)
Pt has been here in about 1 year not new pt  Thyroid concerns needs to determine whether her levels are hypo are hyper Burning sensation on left side  Referral to podiatrist  Referral to GI for colonoscopy  Breast changes sore, burning sensation

## 2020-08-21 LAB — TSH+T4F+T3FREE
Free T4: 1.32 ng/dL (ref 0.82–1.77)
T3, Free: 4.2 pg/mL (ref 2.0–4.4)
TSH: 0.653 u[IU]/mL (ref 0.450–4.500)

## 2020-08-23 NOTE — Progress Notes (Signed)
Please call patient with update.   Thyroid function normal. Will remain on same plan.   Follow-up with primary provider as needed.

## 2020-08-24 ENCOUNTER — Telehealth: Payer: Self-pay

## 2020-08-24 NOTE — Telephone Encounter (Signed)
Patient contacted the office to share updated information on a referral.

## 2020-08-25 ENCOUNTER — Telehealth: Payer: Self-pay

## 2020-08-25 NOTE — Progress Notes (Signed)
Per patient request Gastroenterology location changed to preferred [place] area.

## 2020-08-25 NOTE — Telephone Encounter (Signed)
Please call patient with update.   New referral placed with patient's preferred Podiatrist.

## 2020-08-25 NOTE — Addendum Note (Signed)
Addended by: Camillia Herter on: 08/25/2020 06:16 PM   Modules accepted: Orders

## 2020-08-25 NOTE — Telephone Encounter (Signed)
Noted   Referral was sent to [place] 08/25/2020

## 2020-08-25 NOTE — Telephone Encounter (Signed)
Change location in referral.

## 2020-08-25 NOTE — Addendum Note (Signed)
Addended by: Camillia Herter on: 08/25/2020 06:20 PM   Modules accepted: Orders

## 2020-08-26 NOTE — Telephone Encounter (Signed)
Att to contact pt to advise of referral request to [facility].Provider has sent referral

## 2020-08-27 ENCOUNTER — Ambulatory Visit: Payer: 59 | Admitting: Podiatry

## 2020-08-31 ENCOUNTER — Ambulatory Visit: Payer: 59 | Admitting: Podiatry

## 2020-09-02 ENCOUNTER — Telehealth (INDEPENDENT_AMBULATORY_CARE_PROVIDER_SITE_OTHER): Payer: Self-pay | Admitting: Gastroenterology

## 2020-09-02 ENCOUNTER — Other Ambulatory Visit: Payer: Self-pay

## 2020-09-02 DIAGNOSIS — Z1211 Encounter for screening for malignant neoplasm of colon: Secondary | ICD-10-CM

## 2020-09-02 NOTE — Progress Notes (Signed)
Gastroenterology Pre-Procedure Review  Request Date: Monday 10/04/20 Requesting Physician: Dr. Marius Ditch  Pt will use CLENPIQ for prep  PATIENT REVIEW QUESTIONS: The patient responded to the following health history questions as indicated:    1. Are you having any GI issues? patient states she has hemorroids 2. Do you have a personal history of Polyps? no 3. Do you have a family history of Colon Cancer or Polyps? yes (aunt colon cancer, maternal grandmother colon cance, paternal grandfather colon cancer) 45. Diabetes Mellitus? no 5. Joint replacements in the past 12 months?no 6. Major health problems in the past 3 months?no 7. Any artificial heart valves, MVP, or defibrillator?no    MEDICATIONS & ALLERGIES:    Patient reports the following regarding taking any anticoagulation/antiplatelet therapy:   Plavix, Coumadin, Eliquis, Xarelto, Lovenox, Pradaxa, Brilinta, or Effient? no Aspirin? no  Patient confirms/reports the following medications:  Current Outpatient Medications  Medication Sig Dispense Refill  . azelastine (ASTELIN) 0.1 % nasal spray     . COVID-19 Specimen Collection KIT See admin instructions.    . fluticasone (FLONASE) 50 MCG/ACT nasal spray     . Calcium-Magnesium-Zinc (CALCIUM-MAGNESUIUM-ZINC PO) Take 1 tablet by mouth in the morning, at noon, and at bedtime. (Patient not taking: Reported on 09/02/2020)    . celecoxib (CELEBREX) 100 MG capsule Take 1 capsule (100 mg total) by mouth 2 (two) times daily. (Patient not taking: No sig reported) 6 capsule 0  . CLENPIQ 10-3.5-12 MG-GM -GM/160ML SOLN Take by mouth.    . methocarbamol (ROBAXIN) 500 MG tablet Take 1 tablet (500 mg total) by mouth 4 (four) times daily. (Patient not taking: No sig reported) 80 tablet 0  . ondansetron (ZOFRAN) 4 MG tablet Take 1 tablet (4 mg total) by mouth every 6 (six) hours as needed for nausea or vomiting. (Patient not taking: No sig reported) 20 tablet 0  . PREVIDENT 0.2 % SOLN Take by mouth.    .  senna (SENOKOT) 8.6 MG TABS tablet Take 1 tablet (8.6 mg total) by mouth 2 (two) times daily. (Patient not taking: No sig reported) 120 tablet 0   No current facility-administered medications for this visit.    Patient confirms/reports the following allergies:  Allergies  Allergen Reactions  . Codeine   . Other Anaphylaxis    [place] nut  . Codeine Sulfate     Dizziness, risk of falling    No orders of the defined types were placed in this encounter.   AUTHORIZATION INFORMATION Primary Insurance: 1D#: Group #:  Secondary Insurance: 1D#: Group #:  SCHEDULE INFORMATION: Date: 10/04/20 Time: Location:[facility]

## 2020-09-06 ENCOUNTER — Ambulatory Visit (HOSPITAL_COMMUNITY)
Admission: EM | Admit: 2020-09-06 | Discharge: 2020-09-06 | Disposition: A | Discharge status: Home / Self Care | Payer: 59 | Attending: Family Medicine | Admitting: Family Medicine

## 2020-09-06 ENCOUNTER — Other Ambulatory Visit: Payer: Self-pay

## 2020-09-06 ENCOUNTER — Encounter (HOSPITAL_COMMUNITY): Payer: Self-pay

## 2020-09-06 DIAGNOSIS — S161XXA Strain of muscle, fascia and tendon at neck level, initial encounter: Secondary | ICD-10-CM

## 2020-09-06 DIAGNOSIS — M5412 Radiculopathy, cervical region: Secondary | ICD-10-CM

## 2020-09-06 MED ORDER — METHOCARBAMOL 500 MG PO TABS: 500.0000 mg | ORAL_TABLET | Freq: Three times a day (TID) | ORAL | 0 refills | Status: DC | PRN

## 2020-09-06 MED ORDER — PREDNISONE 20 MG PO TABS: 40.0000 mg | ORAL_TABLET | Freq: Every day | ORAL | 0 refills | Status: DC

## 2020-09-06 NOTE — ED Triage Notes (Signed)
Pt presents with left arm and back pain X 2 days. Pt states the pain started at the left arm and states last night her lower back started hurting. She states the pain has radiated to the neck. She states it is a sharp pain. Pt denies chest pain.

## 2020-09-06 NOTE — Discharge Instructions (Addendum)
Follow up with Neurosurgery if symptoms do not resolve or return

## 2020-09-06 NOTE — ED Provider Notes (Signed)
[facility]    CSN: [id number] Arrival date & time: 09/06/20  0803      History   Chief Complaint Chief Complaint  Patient presents with  . Arm Pain  . Back Pain    HPI Beverly Hughes is a 55 y.o. female.   Here today with constant left sided neck, upper back and left arm pain since yesterday. Pain is sharp, worse with movement, and some associated numbness and tingling of left hand. Denies swelling, redness, rashes, recent heavy lifting or new exercise, fever, chills, CP, SOB, palpitations. States she did sleep wrong two nights ago with her arm pinned differently than normal, feels this may be contributing. Does also have a hx of Cervical DDD s/p cervical fusion about 5 months ago. Taking OTC pain relievers without much benefit.        Past Medical History:  Diagnosis Date  . Allergy   . Arthritis    Phreesia 08/20/2020  . Hypertension    Phreesia 08/20/2020  . Mixed hyperlipidemia   . Nut allergy    [place] nut    Patient Active Problem List   Diagnosis Date Noted  . Hypertensive disorder 08/20/2020  . S/P cervical spinal fusion 10/15/2019  . Degenerative disc disease, cervical 05/01/2019  . Spinal stenosis of cervical region 03/05/2019  . Cervical cord myelomalacia ([place]) 01/14/2019    Past Surgical History:  Procedure Laterality Date  . ANTERIOR CERVICAL CORPECTOMY N/A 10/15/2019   Procedure: ANTERIOR CERVICAL CORPECTOMY C5;  Surgeon: Meade Maw, MD;  Location: [facility];  Service: Neurosurgery;  Laterality: N/A;  . ANTERIOR CERVICAL DECOMP/DISCECTOMY FUSION N/A 10/15/2019   Procedure: C6-7 ANTERIOR CERVICAL DISCECTOMY AND FUSION, C4-7 ANTERIOR SPINAL INSTRUMENTATION;  Surgeon: Meade Maw, MD;  Location: [facility];  Service: Neurosurgery;  Laterality: N/A;  . EYE SURGERY     tear duct repair  . FRACTURE SURGERY     left foot fracture at 55 yrs old  . TUBAL LIGATION      OB History   No obstetric history on file.      Home  Medications    Prior to Admission medications   Medication Sig Start Date End Date Taking? Authorizing Provider  predniSONE (DELTASONE) 20 MG tablet Take 2 tablets (40 mg total) by mouth daily with breakfast. 09/06/20  Yes Volney American, PA-C  Ascorbic Acid (VITAMIN C PO) Take by mouth.    [provider]  azelastine (ASTELIN) 0.1 % nasal spray  01/03/20   [provider]  Calcium-Magnesium-Zinc (CALCIUM-MAGNESUIUM-ZINC PO) Take 1 tablet by mouth in the morning, at noon, and at bedtime. Patient not taking: Reported on 09/02/2020    [provider]  celecoxib (CELEBREX) 100 MG capsule Take 1 capsule (100 mg total) by mouth 2 (two) times daily. Patient not taking: No sig reported 10/17/19   Marin Olp, PA-C  cholecalciferol (VITAMIN D3) 25 MCG (1000 UNIT) tablet Take 1,000 Units by mouth daily.    [provider]  CLENPIQ 10-3.5-12 MG-GM -GM/160ML SOLN Take by mouth. 07/06/20   [provider]  COVID-19 Specimen Collection KIT See admin instructions. 04/23/20   [provider]  fluticasone Asencion Islam) 50 MCG/ACT nasal spray  01/03/20   [provider]  methocarbamol (ROBAXIN) 500 MG tablet Take 1 tablet (500 mg total) by mouth every 8 (eight) hours as needed for muscle spasms. 09/06/20   Volney American, PA-C  Multiple Vitamins-Minerals (ZINC PO) Take by mouth.    [provider]  ondansetron (  ZOFRAN) 4 MG tablet Take 1 tablet (4 mg total) by mouth every 6 (six) hours as needed for nausea or vomiting. Patient not taking: No sig reported 10/17/19   Marin Olp, PA-C  PREVIDENT 0.2 % SOLN Take by mouth. 09/01/20   [provider]  senna (SENOKOT) 8.6 MG TABS tablet Take 1 tablet (8.6 mg total) by mouth 2 (two) times daily. Patient not taking: No sig reported 10/17/19   Marin Olp, PA-C    Family History Family History  Problem Relation Age of Onset  . Diabetes Mother   . Kidney Stones Mother   . Heart  disease Mother   . Cancer Father   . Prostate cancer Father   . Diabetes Maternal Grandmother   . Heart disease Maternal Grandmother   . Hypertension Maternal Grandmother   . Diabetes Maternal Grandfather   . Heart disease Maternal Grandfather   . Hypertension Maternal Grandfather   . Colon cancer Paternal Grandmother   . Hypertension Paternal Grandmother     Social History Social History   Tobacco Use  . Smoking status: Never Smoker  . Smokeless tobacco: Never Used  Vaping Use  . Vaping Use: Never used  Substance Use Topics  . Alcohol use: Yes    Alcohol/week: 2.0 standard drinks    Types: 1 Cans of beer, 1 Glasses of wine per week    Comment: socially  . Drug use: No     Allergies   Codeine, Other, and Codeine sulfate   Review of Systems Review of Systems PER HPI    Physical Exam Triage Vital Signs ED Triage Vitals  Enc Vitals Group     BP 09/06/20 0825 121/79     Pulse Rate 09/06/20 0825 86     Resp 09/06/20 0825 18     Temp 09/06/20 0825 97.8 F (36.6 C)     Temp Source 09/06/20 0825 Oral     SpO2 --      Weight --      Height --      Head Circumference --      Peak Flow --      Pain Score 09/06/20 0824 8     Pain Loc --      Pain Edu? --      Excl. in [place]? --    No data found.  Updated Vital Signs BP 121/79 (BP Location: Right Arm)   Pulse 86   Temp 97.8 F (36.6 C) (Oral)   Resp 18   Visual Acuity Right Eye Distance:   Left Eye Distance:   Bilateral Distance:    Right Eye Near:   Left Eye Near:    Bilateral Near:     Physical Exam Vitals and nursing note reviewed.  Constitutional:      Appearance: Normal appearance. She is not ill-appearing.  HENT:     Head: Atraumatic.  Eyes:     Extraocular Movements: Extraocular movements intact.     Conjunctiva/sclera: Conjunctivae normal.  Cardiovascular:     Rate and Rhythm: Normal rate and regular rhythm.     Pulses: Normal pulses.     Heart sounds: Normal heart sounds.  Pulmonary:      Effort: Pulmonary effort is normal. No respiratory distress.     Breath sounds: Normal breath sounds. No wheezing.  Abdominal:     General: Bowel sounds are normal. There is no distension.     Palpations: Abdomen is soft.     Tenderness: There is no abdominal tenderness.  There is no guarding.  Musculoskeletal:        General: Tenderness (lower midline c spine and t spine ttp) present. No swelling. Normal range of motion.     Cervical back: Normal range of motion and neck supple.     Comments: Left cervical paraspinal muscles ttp extending into trapezius and deltoid muscles  Skin:    General: Skin is warm and dry.     Findings: No bruising, erythema, lesion or rash.  Neurological:     Mental Status: She is alert and oriented to person, place, and time.     Sensory: No sensory deficit.  Psychiatric:        Mood and Affect: Mood normal.        Thought Content: Thought content normal.        Judgment: Judgment normal.     UC Treatments / Results  Labs (all labs ordered are listed, but only abnormal results are displayed) Labs Reviewed - No data to display  EKG   Radiology No results found.  Procedures Procedures (including critical care time)  Medications Ordered in UC Medications - No data to display  Initial Impression / Assessment and Plan / UC Course  I have reviewed the triage vital signs and the nursing notes.  Pertinent labs & imaging results that were available during my care of the patient were reviewed by me and considered in my medical decision making (see chart for details).    Exam and vitals reassuring today, EKG NSR without acute ST or T wave changes. Consistent with radicular pain, likely impingement issue. Will treat with prednisone, robaxin, stretches. F/u with Neurosurgeon if not fully resolving.   Final Clinical Impressions(s) / UC Diagnoses   Final diagnoses:  Strain of neck muscle, initial encounter  Cervical radiculopathy     Discharge  Instructions     Follow up with Neurosurgery if symptoms do not resolve or return    ED Prescriptions    Medication Sig Dispense Auth. Provider   methocarbamol (ROBAXIN) 500 MG tablet Take 1 tablet (500 mg total) by mouth every 8 (eight) hours as needed for muscle spasms. 15 tablet Volney American, [place]   predniSONE (DELTASONE) 20 MG tablet Take 2 tablets (40 mg total) by mouth daily with breakfast. 10 tablet Volney American, [place]     PDMP not reviewed this encounter.   Volney American, [place] 09/06/20 [phone]

## 2020-09-17 DIAGNOSIS — E785 Hyperlipidemia, unspecified: Secondary | ICD-10-CM | POA: Insufficient documentation

## 2020-09-17 DIAGNOSIS — M1990 Unspecified osteoarthritis, unspecified site: Secondary | ICD-10-CM | POA: Insufficient documentation

## 2020-09-25 ENCOUNTER — Emergency Department
Admission: EM | Admit: 2020-09-25 | Discharge: 2020-09-25 | Disposition: A | Discharge status: Home / Self Care | Payer: 59 | Attending: Emergency Medicine | Admitting: Emergency Medicine

## 2020-09-25 ENCOUNTER — Other Ambulatory Visit: Payer: Self-pay

## 2020-09-25 ENCOUNTER — Encounter: Payer: Self-pay | Admitting: Emergency Medicine

## 2020-09-25 DIAGNOSIS — H6121 Impacted cerumen, right ear: Secondary | ICD-10-CM | POA: Insufficient documentation

## 2020-09-25 DIAGNOSIS — H6981 Other specified disorders of Eustachian tube, right ear: Secondary | ICD-10-CM

## 2020-09-25 DIAGNOSIS — I10 Essential (primary) hypertension: Secondary | ICD-10-CM | POA: Diagnosis not present

## 2020-09-25 DIAGNOSIS — H6991 Unspecified Eustachian tube disorder, right ear: Secondary | ICD-10-CM | POA: Insufficient documentation

## 2020-09-25 DIAGNOSIS — H9201 Otalgia, right ear: Secondary | ICD-10-CM | POA: Diagnosis present

## 2020-09-25 MED ORDER — TETRACAINE HCL 0.5 % OP SOLN
3.0000 [drp] | Freq: Once | OPHTHALMIC | Status: AC
  Administered 2020-09-25: 3 [drp] via OPHTHALMIC
  Filled 2020-09-25: qty 4

## 2020-09-25 MED ORDER — ONDANSETRON 4 MG PO TBDP
4.0000 mg | ORAL_TABLET | Freq: Once | ORAL | Status: AC
  Administered 2020-09-25: 4 mg via ORAL
  Filled 2020-09-25: qty 1

## 2020-09-25 MED ORDER — FLUTICASONE PROPIONATE 50 MCG/ACT NA SUSP: 2.0000 | Freq: Every day | NASAL | 0 refills | Status: DC

## 2020-09-25 MED ORDER — PREDNISONE 10 MG PO TABS: ORAL_TABLET | ORAL | 0 refills | Status: DC

## 2020-09-25 NOTE — ED Notes (Signed)
Right ear irrigated with 67ml ns no objects seen, patient now c/o nausea and dizziness

## 2020-09-25 NOTE — ED Notes (Signed)
Pt c/o nausea. Provider notified. Zofran to be given soon.

## 2020-09-25 NOTE — ED Triage Notes (Signed)
Pt reports has a bug in her right ear. Pt reports has been there for 3 days and she can feel it moving.

## 2020-09-25 NOTE — Discharge Instructions (Addendum)
Follow-up with your primary care provider if any continued problems or not improving.  You may need to see an ear nose and throat specialist in [place].  The specialist in [place] is listed on your discharge papers if you prefer to come to [place].  Begin taking Flonase nasal spray 2 sprays each nostril once a day.  You may also take a decongestant with this medication if needed.  The prednisone is 3 tablets once a day for the next 5 days.  Increase fluids.  Tylenol or ibuprofen as needed for pain.

## 2020-09-25 NOTE — ED Provider Notes (Signed)
[facility] Emergency Department Provider Note  ___________________________________________   Event Date/Time   First MD Initiated Contact with Patient 09/25/20 [phone]     (approximate)  I have reviewed the triage vital signs and the nursing notes.   HISTORY  Chief Complaint Foreign Body in Ear   HPI Beverly Hughes is a 55 y.o. female presents to the ED with complaint of bug in her right ear for the last 3 days.  Patient states this morning she can feel it moving and reports that her ear is very sore.  She rates her pain as an 8 out of 10.       Past Medical History:  Diagnosis Date  . Allergy   . Arthritis    Phreesia 08/20/2020  . Hypertension    Phreesia 08/20/2020  . Mixed hyperlipidemia   . Nut allergy    [place] nut    Patient Active Problem List   Diagnosis Date Noted  . Hypertensive disorder 08/20/2020  . S/P cervical spinal fusion 10/15/2019  . Degenerative disc disease, cervical 05/01/2019  . Spinal stenosis of cervical region 03/05/2019  . Cervical cord myelomalacia ([place]) 01/14/2019    Past Surgical History:  Procedure Laterality Date  . ANTERIOR CERVICAL CORPECTOMY N/A 10/15/2019   Procedure: ANTERIOR CERVICAL CORPECTOMY C5;  Surgeon: Meade Maw, MD;  Location: [facility] OR[facility];  Service: Neurosurgery;  Laterality: N/A;  . ANTERIOR CERVICAL DECOMP/DISCECTOMY FUSION N/A 10/15/2019   Procedure: C6-7 ANTERIOR CERVICAL DISCECTOMY AND FUSION, C4-7 ANTERIOR SPINAL INSTRUMENTATION;  Surgeon: Meade Maw, MD;  Location: [facility] ORS;  Service: Neurosurgery;  Laterality: N/A;  . EYE SURGERY     tear duct repair  . FRACTURE SURGERY     left foot fracture at 55 yrs old  . TUBAL LIGATION      Prior to Admission medications   Medication Sig Start Date End Date Taking? Authorizing Provider  fluticasone (FLONASE) 50 MCG/ACT nasal spray Place 2 sprays into both nostrils daily. 09/25/20 09/25/21 Yes Shanitra Phillippi L, PA-C  predniSONE  (DELTASONE) 10 MG tablet Take 3 tablets once a day for 5 days 09/25/20  Yes Letitia Neri L, PA-C  Ascorbic Acid (VITAMIN C PO) Take by mouth.    [provider]  azelastine (ASTELIN) 0.1 % nasal spray  01/03/20   [provider]  Calcium-Magnesium-Zinc (CALCIUM-MAGNESUIUM-ZINC PO) Take 1 tablet by mouth in the morning, at noon, and at bedtime. Patient not taking: Reported on 09/02/2020    [provider]  celecoxib (CELEBREX) 100 MG capsule Take 1 capsule (100 mg total) by mouth 2 (two) times daily. Patient not taking: No sig reported 10/17/19   Marin Olp, PA-C  cholecalciferol (VITAMIN D3) 25 MCG (1000 UNIT) tablet Take 1,000 Units by mouth daily.    [provider]  CLENPIQ 10-3.5-12 MG-GM -GM/160ML SOLN Take by mouth. 07/06/20   [provider]  COVID-19 Specimen Collection KIT See admin instructions. 04/23/20   [provider]  methocarbamol (ROBAXIN) 500 MG tablet Take 1 tablet (500 mg total) by mouth every 8 (eight) hours as needed for muscle spasms. 09/06/20   Volney American, PA-C  Multiple Vitamins-Minerals (ZINC PO) Take by mouth.    [provider]  ondansetron (ZOFRAN) 4 MG tablet Take 1 tablet (4 mg total) by mouth every 6 (six) hours as needed for nausea or vomiting. Patient not taking: No sig reported 10/17/19   Marin Olp, PA-C  PREVIDENT 0.2 % SOLN Take by mouth. 09/01/20   [provider]  senna (SENOKOT) 8.6 MG TABS tablet Take 1 tablet (8.6 mg total) by mouth 2 (two) times daily. Patient not taking: No sig reported 10/17/19   Marin Olp, PA-C    Allergies Codeine, Other, and Codeine sulfate  Family History  Problem Relation Age of Onset  . Diabetes Mother   . Kidney Stones Mother   . Heart disease Mother   . Cancer Father   . Prostate cancer Father   . Diabetes Maternal Grandmother   . Heart disease Maternal Grandmother   . Hypertension Maternal Grandmother   . Diabetes Maternal  Grandfather   . Heart disease Maternal Grandfather   . Hypertension Maternal Grandfather   . Colon cancer Paternal Grandmother   . Hypertension Paternal Grandmother     Social History Social History   Tobacco Use  . Smoking status: Never Smoker  . Smokeless tobacco: Never Used  Vaping Use  . Vaping Use: Never used  Substance Use Topics  . Alcohol use: Yes    Alcohol/week: 2.0 standard drinks    Types: 1 Cans of beer, 1 Glasses of wine per week    Comment: socially  . Drug use: No    Review of Systems Constitutional: No fever/chills Eyes: No visual changes. ENT: Positive for possible bug in right ear. Cardiovascular: Denies chest pain. Respiratory: Denies shortness of breath. Gastrointestinal:   No nausea, no vomiting.  Musculoskeletal: Negative for musculoskeletal pain Skin: Negative for rash. Neurological: Negative for headaches, focal weakness or numbness. ____________________________________________   PHYSICAL EXAM:  VITAL SIGNS: ED Triage Vitals  Enc Vitals Group     BP 09/25/20 0916 (!) 141/91     Pulse Rate 09/25/20 0916 76     Resp 09/25/20 0916 20     Temp 09/25/20 0916 98.1 F (36.7 C)     Temp Source 09/25/20 0916 Oral     SpO2 09/25/20 0916 98 %     Weight 09/25/20 0916 200 lb (90.7 kg)     Height 09/25/20 0916 $RemoveBefor'5\' 5"'[id number]$  (1.651 m)     Head Circumference --      Peak Flow --      Pain Score 09/25/20 0912 8     Pain Loc --      Pain Edu? --      Excl. in [place]? --     Constitutional: Alert and oriented. Well appearing and in no acute distress. Eyes: Conjunctivae are normal.  Head: Atraumatic. Nose: No congestion/rhinnorhea. EAR: Left EAC and TM are clear.  Right EAC with minimal cerumen and no obvious insect is noted.  TM is without erythema or irritation. Mouth/Throat: Mucous membranes are moist.  Oropharynx non-erythematous. Neck: No stridor.   Cardiovascular: Normal rate, regular rhythm. Grossly normal heart sounds.  Good peripheral  circulation. Respiratory: Normal respiratory effort.  No retractions. Lungs CTAB. Musculoskeletal: Moves upper and lower extremities without difficulty.  Normal gait was noted. Neurologic:  Normal speech and language. No gross focal neurologic deficits are appreciated. No gait instability. Skin:  Skin is warm, dry and intact. No rash noted. Psychiatric: Mood and affect are normal. Speech and behavior are normal.  ____________________________________________   LABS (all labs ordered are listed, but only abnormal results are displayed)  Labs Reviewed - No data to display   PROCEDURES  Procedure(s) performed (including Critical Care):  Procedures   ____________________________________________   INITIAL IMPRESSION / ASSESSMENT AND PLAN / ED COURSE  As part of my medical decision making, I reviewed the following data within  the electronic MEDICAL RECORD NUMBER Notes from prior ED visits and [place] Controlled Substance Database  55 year old female presents to the ED with complaint of possible insect in her right ear for the last 3 days.  Patient was seen in triage and tetracaine was placed in the ear not only to numb the ear canal but also possibly smother and kill the insect.  No insect was seen in triage.  When she arrived in flex the right ear was irrigated with 20 mL of normal saline without any objects or insect seen.  With talking with patient she describes her sensation that she has now stating that the bug is still there.  Her description is more suspicious for a eustachian tube dysfunction.  Patient was asked to blow her nose to see if her ear would pop which it did not.  Her hearing is muffled in her right ear.  Patient was reassured that there is no insect in her right ear.  We discussed Flonase nasal spray and prednisone for the next 5 days and a low dose.  She is to follow-up with her PCP or be referred to an ENT in [place] if she continues to have problems.  She may also see Dr. Pryor Ochoa  at [facility] if any continued problems and his contact information was listed on her discharge papers.  ____________________________________________   FINAL CLINICAL IMPRESSION(S) / ED DIAGNOSES  Final diagnoses:  Eustachian tube dysfunction, right     ED Discharge Orders         Ordered    fluticasone (FLONASE) 50 MCG/ACT nasal spray  Daily        09/25/20 1155    predniSONE (DELTASONE) 10 MG tablet        09/25/20 1155          *Please note:  Beverly Hughes was evaluated in Emergency Department on 09/25/2020 for the symptoms described in the history of present illness. She was evaluated in the context of the global COVID-19 pandemic, which necessitated consideration that the patient might be at risk for infection with the SARS-CoV-2 virus that causes COVID-19. Institutional protocols and algorithms that pertain to the evaluation of patients at risk for COVID-19 are in a state of rapid change based on information released by regulatory bodies including the CDC and federal and state organizations. These policies and algorithms were followed during the patient's care in the ED.  Some ED evaluations and interventions may be delayed as a result of limited staffing during and the pandemic.*   Note:  This document was prepared using Dragon voice recognition software and may include unintentional dictation errors.    Johnn Hai, PA-C 09/25/20 1215    Lucrezia Starch, MD 09/25/20 [phone]

## 2020-09-30 ENCOUNTER — Other Ambulatory Visit: Payer: Self-pay

## 2020-09-30 ENCOUNTER — Other Ambulatory Visit
Admission: RE | Admit: 2020-09-30 | Discharge: 2020-09-30 | Disposition: A | Discharge status: Home / Self Care | Payer: 59 | Source: Ambulatory Visit | Attending: Gastroenterology | Admitting: Gastroenterology

## 2020-09-30 DIAGNOSIS — Z01812 Encounter for preprocedural laboratory examination: Secondary | ICD-10-CM | POA: Diagnosis present

## 2020-09-30 DIAGNOSIS — Z20822 Contact with and (suspected) exposure to covid-19: Secondary | ICD-10-CM | POA: Insufficient documentation

## 2020-09-30 LAB — SARS CORONAVIRUS 2 (TAT 6-24 HRS): SARS Coronavirus 2: NEGATIVE

## 2020-10-01 ENCOUNTER — Ambulatory Visit (INDEPENDENT_AMBULATORY_CARE_PROVIDER_SITE_OTHER): Payer: 59 | Admitting: Family

## 2020-10-01 ENCOUNTER — Encounter: Payer: Self-pay | Admitting: Family

## 2020-10-01 VITALS — BP 133/86 | HR 79 | Ht 64.65 in | Wt 207.4 lb

## 2020-10-01 DIAGNOSIS — Z131 Encounter for screening for diabetes mellitus: Secondary | ICD-10-CM

## 2020-10-01 DIAGNOSIS — Z1329 Encounter for screening for other suspected endocrine disorder: Secondary | ICD-10-CM

## 2020-10-01 DIAGNOSIS — Z0000 Encounter for general adult medical examination without abnormal findings: Secondary | ICD-10-CM | POA: Diagnosis not present

## 2020-10-01 DIAGNOSIS — Z13228 Encounter for screening for other metabolic disorders: Secondary | ICD-10-CM

## 2020-10-01 DIAGNOSIS — H6981 Other specified disorders of Eustachian tube, right ear: Secondary | ICD-10-CM

## 2020-10-01 DIAGNOSIS — Z130 Encounter for screening for diseases of the blood and blood-forming organs and certain disorders involving the immune mechanism: Secondary | ICD-10-CM

## 2020-10-01 DIAGNOSIS — Z13220 Encounter for screening for lipoid disorders: Secondary | ICD-10-CM

## 2020-10-01 NOTE — Progress Notes (Signed)
Patient ID: Beverly Hughes, female    DOB: Apr 27, 1966  MRN: [id number]  CC: Annual Physical Exam  Subjective: Beverly Hughes is a 55 y.o. female who presents for annual physical exam. Her concerns today include:  Reports recently seen at the emergency department with right eustachian tube dysfunction. She was given Prednisone and finished course of therapy. Using Flonase as needed without complete relief. She doesn't feel like this is completely resolved. She has an ENT doctor in [facility] and planning to follow-up soon.   Patient Active Problem List   Diagnosis Date Noted  . Hypertensive disorder 08/20/2020  . S/P cervical spinal fusion 10/15/2019  . Degenerative disc disease, cervical 05/01/2019  . Spinal stenosis of cervical region 03/05/2019  . Cervical cord myelomalacia ([place]) 01/14/2019     Current Outpatient Medications on File Prior to Visit  Medication Sig Dispense Refill  . Ascorbic Acid (VITAMIN C PO) Take by mouth.    . Calcium-Magnesium-Zinc (CALCIUM-MAGNESUIUM-ZINC PO) Take 1 tablet by mouth in the morning, at noon, and at bedtime.    . cholecalciferol (VITAMIN D3) 25 MCG (1000 UNIT) tablet Take 1,000 Units by mouth daily.    . fluticasone (FLONASE) 50 MCG/ACT nasal spray Place 2 sprays into both nostrils daily. 16 g 0  . methocarbamol (ROBAXIN) 500 MG tablet Take 1 tablet (500 mg total) by mouth every 8 (eight) hours as needed for muscle spasms. 15 tablet 0  . Multiple Vitamins-Minerals (ZINC PO) Take by mouth.    Marland Kitchen omeprazole (PRILOSEC) 20 MG capsule omeprazole 20 mg capsule,delayed release    . traMADol (ULTRAM) 50 MG tablet Take 50 mg by mouth every 6 (six) hours as needed.     No current facility-administered medications on file prior to visit.    Allergies  Allergen Reactions  . Codeine   . Other Anaphylaxis    [place] nut  . Codeine Sulfate     Dizziness, risk of falling    Social History   Socioeconomic History  . Marital status: Single     Spouse name: Not on file  . Number of children: Not on file  . Years of education: Not on file  . Highest education level: Not on file  Occupational History  . Not on file  Tobacco Use  . Smoking status: Never Smoker  . Smokeless tobacco: Never Used  Vaping Use  . Vaping Use: Never used  Substance and Sexual Activity  . Alcohol use: Yes    Alcohol/week: 2.0 standard drinks    Types: 1 Cans of beer, 1 Glasses of wine per week    Comment: socially  . Drug use: No  . Sexual activity: Not on file  Other Topics Concern  . Not on file  Social History Narrative  . Not on file   Social Determinants of Health   Financial Resource Strain: Not on file  Food Insecurity: Not on file  Transportation Needs: Not on file  Physical Activity: Not on file  Stress: Not on file  Social Connections: Not on file  Intimate Partner Violence: Not on file    Family History  Problem Relation Age of Onset  . Diabetes Mother   . Kidney Stones Mother   . Heart disease Mother   . Cancer Father   . Prostate cancer Father   . Diabetes Maternal Grandmother   . Heart disease Maternal Grandmother   . Hypertension Maternal Grandmother   . Diabetes Maternal Grandfather   . Heart disease Maternal Grandfather   .  Hypertension Maternal Grandfather   . Colon cancer Paternal Grandmother   . Hypertension Paternal Grandmother     Past Surgical History:  Procedure Laterality Date  . ANTERIOR CERVICAL CORPECTOMY N/A 10/15/2019   Procedure: ANTERIOR CERVICAL CORPECTOMY C5;  Surgeon: Meade Maw, MD;  Location: [facility] ORS;  Service: Neurosurgery;  Laterality: N/A;  . ANTERIOR CERVICAL DECOMP/DISCECTOMY FUSION N/A 10/15/2019   Procedure: C6-7 ANTERIOR CERVICAL DISCECTOMY AND FUSION, C4-7 ANTERIOR SPINAL INSTRUMENTATION;  Surgeon: Meade Maw, MD;  Location: [facility] ORS;  Service: Neurosurgery;  Laterality: N/A;  . EYE SURGERY     tear duct repair  . FRACTURE SURGERY     left foot fracture at 55  yrs old  . TUBAL LIGATION      ROS: Review of Systems Negative except as stated above  PHYSICAL EXAM: BP 133/86 (BP Location: Left Arm, Patient Position: Sitting)   Pulse 79   Ht 5' 4.65" (1.642 m)   Wt 207 lb 6.4 oz (94.1 kg)   SpO2 96%   BMI 34.89 kg/m   Wt Readings from Last 3 Encounters:  10/01/20 207 lb 6.4 oz (94.1 kg)  09/25/20 200 lb (90.7 kg)  08/20/20 201 lb (91.2 kg)    Physical Exam Exam conducted with a chaperone present.  HENT:     Head: Normocephalic and atraumatic.     Right Ear: Tympanic membrane, ear canal and external ear normal.     Left Ear: Tympanic membrane, ear canal and external ear normal.     Nose: Nose normal.     Mouth/Throat:     Mouth: Mucous membranes are moist.     Pharynx: Oropharynx is clear.  Eyes:     Extraocular Movements: Extraocular movements intact.     Conjunctiva/sclera: Conjunctivae normal.     Pupils: Pupils are equal, round, and reactive to light.  Cardiovascular:     Rate and Rhythm: Normal rate and regular rhythm.     Pulses: Normal pulses.     Heart sounds: Normal heart sounds.  Pulmonary:     Effort: Pulmonary effort is normal.     Breath sounds: Normal breath sounds.  Chest:  Breasts:     Right: Normal.     Left: Normal.      Comments: Elmon Else, CMA present during examination.  Abdominal:     General: Bowel sounds are normal.     Palpations: Abdomen is soft.  Genitourinary:    Comments: Patient declined examination.  Musculoskeletal:        General: Normal range of motion.     Cervical back: Normal range of motion and neck supple.  Skin:    General: Skin is warm and dry.     Capillary Refill: Capillary refill takes less than 2 seconds.  Neurological:     General: No focal deficit present.     Mental Status: She is alert and oriented to person, place, and time.  Psychiatric:        Mood and Affect: Mood normal.        Behavior: Behavior normal.     ASSESSMENT AND PLAN: 1. Annual physical  exam: - Counseled on 150 minutes of exercise per week as tolerated, healthy eating (including decreased daily intake of saturated fats, cholesterol, added sugars, sodium), STI prevention, and routine healthcare maintenance.  2. Screening for metabolic disorder: - BMP last obtained 07/19/2020. - Hepatic function panel to check liver function.  - Hepatic function panel  3. Screening for deficiency anemia: - CBC last obtained 07/19/2020.  4. Diabetes mellitus screening: - Hemoglobin A1c to screen for pre-diabetes/diabetes. - Hemoglobin A1c  5. Screening cholesterol level: - Lipid panel to screen for high cholesterol.  - Lipid Panel  6. Thyroid disorder screen: - TSH last obtained 08/20/2020.  7. ETD (Eustachian tube dysfunction), right: - Stable.  - Keep appointments with ENT.   Patient was given the opportunity to ask questions.  Patient verbalized understanding of the plan and was able to repeat key elements of the plan. Patient was given clear instructions to go to Emergency Department or return to medical center if symptoms don't improve, worsen, or new problems develop.The patient verbalized understanding.   Orders Placed This Encounter  Procedures  . Hepatic function panel  . Lipid Panel  . Hemoglobin A1c    Requested Prescriptions    No prescriptions requested or ordered in this encounter    Follow-up with primary provider as scheduled.  Camillia Herter, NP

## 2020-10-01 NOTE — Patient Instructions (Signed)
 Annual physical exam and labs today.   Preventive Care 40-53 Years Old, Female Preventive care refers to lifestyle choices and visits with your health care provider that can promote health and wellness. This includes:  A yearly physical exam. This is also called an annual wellness visit.  Regular dental and eye exams.  Immunizations.  Screening for certain conditions.  Healthy lifestyle choices, such as: ? Eating a healthy diet. ? Getting regular exercise. ? Not using drugs or products that contain nicotine and tobacco. ? Limiting alcohol use. What can I expect for my preventive care visit? Physical exam Your health care provider will check your:  Height and weight. These may be used to calculate your BMI (body mass index). BMI is a measurement that tells if you are at a healthy weight.  Heart rate and blood pressure.  Body temperature.  Skin for abnormal spots. Counseling Your health care provider may ask you questions about your:  Past medical problems.  Family's medical history.  Alcohol, tobacco, and drug use.  Emotional well-being.  Home life and relationship well-being.  Sexual activity.  Diet, exercise, and sleep habits.  Work and work environment.  Access to firearms.  Method of birth control.  Menstrual cycle.  Pregnancy history. What immunizations do I need? Vaccines are usually given at various ages, according to a schedule. Your health care provider will recommend vaccines for you based on your age, medical history, and lifestyle or other factors, such as travel or where you work.   What tests do I need? Blood tests  Lipid and cholesterol levels. These may be checked every 5 years, or more often if you are over 50 years old.  Hepatitis C test.  Hepatitis B test. Screening  Lung cancer screening. You may have this screening every year starting at age 55 if you have a 30-pack-year history of smoking and currently smoke or have quit within  the past 15 years.  Colorectal cancer screening. ? All adults should have this screening starting at age 50 and continuing until age 75. ? Your health care provider may recommend screening at age 45 if you are at increased risk. ? You will have tests every 1-10 years, depending on your results and the type of screening test.  Diabetes screening. ? This is done by checking your blood sugar (glucose) after you have not eaten for a while (fasting). ? You may have this done every 1-3 years.  Mammogram. ? This may be done every 1-2 years. ? Talk with your health care provider about when you should start having regular mammograms. This may depend on whether you have a family history of breast cancer.  BRCA-related cancer screening. This may be done if you have a family history of breast, ovarian, tubal, or peritoneal cancers.  Pelvic exam and Pap test. ? This may be done every 3 years starting at age 21. ? Starting at age 30, this may be done every 5 years if you have a Pap test in combination with an HPV test. Other tests  STD (sexually transmitted disease) testing, if you are at risk.  Bone density scan. This is done to screen for osteoporosis. You may have this scan if you are at high risk for osteoporosis. Talk with your health care provider about your test results, treatment options, and if necessary, the need for more tests. Follow these instructions at home: Eating and drinking  Eat a diet that includes fresh fruits and vegetables, whole grains, lean protein, and   low-fat dairy products.  Take vitamin and mineral supplements as recommended by your health care provider.  Do not drink alcohol if: ? Your health care provider tells you not to drink. ? You are pregnant, may be pregnant, or are planning to become pregnant.  If you drink alcohol: ? Limit how much you have to 0-1 drink a day. ? Be aware of how much alcohol is in your drink. In the [place] one drink equals one 12 oz  bottle of beer (355 mL), one 5 oz glass of wine (148 mL), or one 1 oz glass of hard liquor (44 mL).   Lifestyle  Take daily care of your teeth and gums. Brush your teeth every morning and night with fluoride toothpaste. Floss one time each day.  Stay active. Exercise for at least 30 minutes 5 or more days each week.  Do not use any products that contain nicotine or tobacco, such as cigarettes, e-cigarettes, and chewing tobacco. If you need help quitting, ask your health care provider.  Do not use drugs.  If you are sexually active, practice safe sex. Use a condom or other form of protection to prevent STIs (sexually transmitted infections).  If you do not wish to become pregnant, use a form of birth control. If you plan to become pregnant, see your health care provider for a prepregnancy visit.  If told by your health care provider, take low-dose aspirin daily starting at age 50.  Find healthy ways to cope with stress, such as: ? Meditation, yoga, or listening to music. ? Journaling. ? Talking to a trusted person. ? Spending time with friends and family. Safety  Always wear your seat belt while driving or riding in a vehicle.  Do not drive: ? If you have been drinking alcohol. Do not ride with someone who has been drinking. ? When you are tired or distracted. ? While texting.  Wear a helmet and other protective equipment during sports activities.  If you have firearms in your house, make sure you follow all gun safety procedures. What's next?  Visit your health care provider once a year for an annual wellness visit.  Ask your health care provider how often you should have your eyes and teeth checked.  Stay up to date on all vaccines. This information is not intended to replace advice given to you by your health care provider. Make sure you discuss any questions you have with your health care provider. Document Revised: 04/06/2020 Document Reviewed: 03/14/2018 Elsevier  Patient Education  2021 Elsevier Inc.  

## 2020-10-01 NOTE — Progress Notes (Signed)
Physical Right ear fullness  Has an ENT in [place] will have him send records at next visit

## 2020-10-02 LAB — HEMOGLOBIN A1C
Est. average glucose Bld gHb Est-mCnc: 137 mg/dL
Hgb A1c MFr Bld: 6.4 % — ABNORMAL HIGH (ref 4.8–5.6)

## 2020-10-02 LAB — HEPATIC FUNCTION PANEL
ALT: 20 IU/L (ref 0–32)
AST: 18 IU/L (ref 0–40)
Albumin: 4.2 g/dL (ref 3.8–4.9)
Alkaline Phosphatase: 58 IU/L (ref 44–121)
Bilirubin Total: <0.2 mg/dL (ref 0.0–1.2)
Bilirubin, Direct: <0.1 mg/dL (ref 0.00–0.40)
Total Protein: 6.8 g/dL (ref 6.0–8.5)

## 2020-10-02 LAB — LIPID PANEL
Chol/HDL Ratio: 2.1 ratio (ref 0.0–4.4)
Cholesterol, Total: 125 mg/dL (ref 100–199)
HDL: 60 mg/dL (ref >39)
LDL Chol Calc (NIH): 51 mg/dL (ref 0–99)
Triglycerides: 68 mg/dL (ref 0–149)
VLDL Cholesterol Cal: 14 mg/dL (ref 5–40)

## 2020-10-02 NOTE — Progress Notes (Signed)
Liver function normal.   Cholesterol normal.   Your hemoglobin A1c is consistent with pre-diabetes. Practice healthy eating habits of fresh fruit and vegetables, lean baked meats such as chicken, fish, and [place]; limit breads, rice, pastas, and desserts; practice regular aerobic exercise (at least 150 minutes a week as tolerated). Patient encouraged to schedule appointment to have hemoglobin A1c rechecked in 6 months or sooner if needed.

## 2020-10-04 ENCOUNTER — Other Ambulatory Visit: Payer: Self-pay

## 2020-10-04 ENCOUNTER — Ambulatory Visit: Payer: 59 | Admitting: Certified Registered"

## 2020-10-04 ENCOUNTER — Encounter
Admission: RE | Disposition: A | Discharge status: Home / Self Care | Payer: Self-pay | Source: Ambulatory Visit | Attending: Gastroenterology

## 2020-10-04 ENCOUNTER — Ambulatory Visit
Admission: RE | Admit: 2020-10-04 | Discharge: 2020-10-04 | Disposition: A | Discharge status: Home / Self Care | Payer: 59 | Source: Ambulatory Visit | Attending: Gastroenterology | Admitting: Gastroenterology

## 2020-10-04 ENCOUNTER — Encounter: Payer: Self-pay | Admitting: Gastroenterology

## 2020-10-04 DIAGNOSIS — Z981 Arthrodesis status: Secondary | ICD-10-CM | POA: Diagnosis not present

## 2020-10-04 DIAGNOSIS — Z8249 Family history of ischemic heart disease and other diseases of the circulatory system: Secondary | ICD-10-CM | POA: Diagnosis not present

## 2020-10-04 DIAGNOSIS — Z888 Allergy status to other drugs, medicaments and biological substances status: Secondary | ICD-10-CM | POA: Insufficient documentation

## 2020-10-04 DIAGNOSIS — Z1211 Encounter for screening for malignant neoplasm of colon: Secondary | ICD-10-CM | POA: Diagnosis not present

## 2020-10-04 DIAGNOSIS — D128 Benign neoplasm of rectum: Secondary | ICD-10-CM | POA: Insufficient documentation

## 2020-10-04 DIAGNOSIS — Z8042 Family history of malignant neoplasm of prostate: Secondary | ICD-10-CM | POA: Insufficient documentation

## 2020-10-04 DIAGNOSIS — Z885 Allergy status to narcotic agent status: Secondary | ICD-10-CM | POA: Diagnosis not present

## 2020-10-04 DIAGNOSIS — Z800 Family history of malignant neoplasm of digestive organs: Secondary | ICD-10-CM | POA: Diagnosis not present

## 2020-10-04 DIAGNOSIS — K644 Residual hemorrhoidal skin tags: Secondary | ICD-10-CM | POA: Diagnosis not present

## 2020-10-04 DIAGNOSIS — Z833 Family history of diabetes mellitus: Secondary | ICD-10-CM | POA: Diagnosis not present

## 2020-10-04 DIAGNOSIS — Z841 Family history of disorders of kidney and ureter: Secondary | ICD-10-CM | POA: Diagnosis not present

## 2020-10-04 DIAGNOSIS — K635 Polyp of colon: Secondary | ICD-10-CM

## 2020-10-04 DIAGNOSIS — Z79899 Other long term (current) drug therapy: Secondary | ICD-10-CM | POA: Diagnosis not present

## 2020-10-04 HISTORY — PX: COLONOSCOPY WITH PROPOFOL: SHX5780

## 2020-10-04 LAB — POCT PREGNANCY, URINE: Preg Test, Ur: NEGATIVE

## 2020-10-04 SURGERY — COLONOSCOPY WITH PROPOFOL
Anesthesia: General

## 2020-10-04 MED ORDER — PROPOFOL 10 MG/ML IV BOLUS
INTRAVENOUS | Status: DC | PRN
  Administered 2020-10-04: 80 mg via INTRAVENOUS

## 2020-10-04 MED ORDER — LIDOCAINE HCL (CARDIAC) PF 100 MG/5ML IV SOSY
PREFILLED_SYRINGE | INTRAVENOUS | Status: DC | PRN
  Administered 2020-10-04: 50 mg via INTRAVENOUS

## 2020-10-04 MED ORDER — PROPOFOL 500 MG/50ML IV EMUL
INTRAVENOUS | Status: DC | PRN
  Administered 2020-10-04: 150 ug/kg/min via INTRAVENOUS

## 2020-10-04 MED ORDER — SODIUM CHLORIDE 0.9 % IV SOLN: INTRAVENOUS | Status: DC

## 2020-10-04 MED ORDER — PROPOFOL 10 MG/ML IV BOLUS
INTRAVENOUS | Status: AC
  Filled 2020-10-04: qty 20

## 2020-10-04 MED ORDER — PROPOFOL 500 MG/50ML IV EMUL
INTRAVENOUS | Status: AC
  Filled 2020-10-04: qty 50

## 2020-10-04 NOTE — H&P (Signed)
Beverly Darby, MD [address]  [place]  [place], [place] [address]  Main: [phone]  Fax: [phone] Pager: [phone]  Primary Care Physician:  Camillia Herter, NP Primary Gastroenterologist:  Dr. Cephas Hughes  Pre-Procedure History & Physical: HPI:  Beverly Hughes is a 55 y.o. female is here for an colonoscopy.   Past Medical History:  Diagnosis Date   Allergy    Arthritis    Phreesia 08/20/2020   Hypertension    Phreesia 08/20/2020   Mixed hyperlipidemia    Nut allergy    [place] nut    Past Surgical History:  Procedure Laterality Date   ANTERIOR CERVICAL CORPECTOMY N/A 10/15/2019   Procedure: ANTERIOR CERVICAL CORPECTOMY C5;  Surgeon: Meade Maw, MD;  Location: [facility] ORS;  Service: Neurosurgery;  Laterality: N/A;   ANTERIOR CERVICAL DECOMP/DISCECTOMY FUSION N/A 10/15/2019   Procedure: C6-7 ANTERIOR CERVICAL DISCECTOMY AND FUSION, C4-7 ANTERIOR SPINAL INSTRUMENTATION;  Surgeon: Meade Maw, MD;  Location: [facility] ORS;  Service: Neurosurgery;  Laterality: N/A;   EYE SURGERY     tear duct repair   FRACTURE SURGERY     left foot fracture at 55 yrs old   TUBAL LIGATION      Prior to Admission medications   Medication Sig Start Date End Date Taking? Authorizing Provider  Ascorbic Acid (VITAMIN C PO) Take by mouth.   Yes [provider]  Calcium-Magnesium-Zinc (CALCIUM-MAGNESUIUM-ZINC PO) Take 1 tablet by mouth in the morning, at noon, and at bedtime.   Yes [provider]  cholecalciferol (VITAMIN D3) 25 MCG (1000 UNIT) tablet Take 1,000 Units by mouth daily.   Yes [provider]  fluticasone (FLONASE) 50 MCG/ACT nasal spray Place 2 sprays into both nostrils daily. 09/25/20 09/25/21 Yes Johnn Hai, PA-C  methocarbamol (ROBAXIN) 500 MG tablet Take 1 tablet (500 mg total) by mouth every 8 (eight) hours as needed for muscle spasms. 09/06/20  Yes Volney American, PA-C  Multiple Vitamins-Minerals (ZINC PO)  Take by mouth.   Yes [provider]  omeprazole (PRILOSEC) 20 MG capsule omeprazole 20 mg capsule,delayed release   Yes [provider]  traMADol (ULTRAM) 50 MG tablet Take 50 mg by mouth every 6 (six) hours as needed.   Yes [provider]    Allergies as of 09/02/2020 - Review Complete 09/02/2020  Allergen Reaction Noted   Codeine  08/20/2020   Other Anaphylaxis 12/31/2018   Codeine sulfate  07/26/2013    Family History  Problem Relation Age of Onset   Diabetes Mother    Kidney Stones Mother    Heart disease Mother    Cancer Father    Prostate cancer Father    Diabetes Maternal Grandmother    Heart disease Maternal Grandmother    Hypertension Maternal Grandmother    Diabetes Maternal Grandfather    Heart disease Maternal Grandfather    Hypertension Maternal Grandfather    Colon cancer Paternal Grandmother    Hypertension Paternal Grandmother     Social History   Socioeconomic History   Marital status: Single    Spouse name: Not on file   Number of children: Not on file   Years of education: Not on file   Highest education level: Not on file  Occupational History   Not on file  Tobacco Use   Smoking status: Never Smoker   Smokeless tobacco: Never Used  Vaping Use   Vaping Use: Never used  Substance and Sexual Activity   Alcohol use: Yes  Alcohol/week: 2.0 standard drinks    Types: 1 Cans of beer, 1 Glasses of wine per week    Comment: socially   Drug use: No   Sexual activity: Not on file  Other Topics Concern   Not on file  Social History Narrative   Not on file   Social Determinants of Health   Financial Resource Strain: Not on file  Food Insecurity: Not on file  Transportation Needs: Not on file  Physical Activity: Not on file  Stress: Not on file  Social Connections: Not on file  Intimate Partner Violence: Not on file    Review of Systems: See HPI, otherwise negative ROS  Physical Exam: BP (!) 145/95   Pulse 80    Temp 98 F (36.7 C)   Resp 18   Ht 5\' 4"  (1.626 m)   Wt 94.1 kg   LMP  (LMP Unknown)   SpO2 100%   BMI 35.61 kg/m  General:   Alert,  pleasant and cooperative in NAD Head:  Normocephalic and atraumatic. Neck:  Supple; no masses or thyromegaly. Lungs:  Clear throughout to auscultation.    Heart:  Regular rate and rhythm. Abdomen:  Soft, nontender and nondistended. Normal bowel sounds, without guarding, and without rebound.   Neurologic:  Alert and  oriented x4;  grossly normal neurologically.  Impression/Plan: Beverly Hughes is here for an colonoscopy to be performed for colon cancer screening  Risks, benefits, limitations, and alternatives regarding  colonoscopy have been reviewed with the patient.  Questions have been answered.  All parties agreeable.   Sherri Sear, MD  10/04/2020, 10:18 AM

## 2020-10-04 NOTE — Transfer of Care (Signed)
Immediate Anesthesia Transfer of Care Note  Patient: Beverly Hughes  Procedure(s) Performed: COLONOSCOPY WITH PROPOFOL (N/A )  Patient Location: PACU and Endoscopy Unit  Anesthesia Type:General  Level of Consciousness: drowsy  Airway & Oxygen Therapy: Patient Spontanous Breathing  Post-op Assessment: Report given to RN  Post vital signs: stable  Last Vitals:  Vitals Value Taken Time  BP    Temp    Pulse    Resp    SpO2      Last Pain:  Vitals:   10/04/20 0912  PainSc: 0-No pain         Complications: No complications documented.

## 2020-10-04 NOTE — Op Note (Signed)
[facility] Patient Name: Beverly Hughes Procedure Date: 10/04/2020 11:16 AM MRN: [id number] Account #: [id number] Date of Birth: [birth year] Admit Type: Outpatient Age: 55 Room: [facility] ENDO ROOM 2 Gender: Female Note Status: Finalized Procedure:             Colonoscopy Indications:           Screening for colorectal malignant neoplasm, This is                         the patient's first colonoscopy Providers:             Lin Landsman MD, MD Medicines:             General Anesthesia Complications:         No immediate complications. Estimated blood loss: None. Procedure:             Pre-Anesthesia Assessment:                        - Prior to the procedure, a History and Physical was                         performed, and patient medications and allergies were                         reviewed. The patient is competent. The risks and                         benefits of the procedure and the sedation options and                         risks were discussed with the patient. All questions                         were answered and informed consent was obtained.                         Patient identification and proposed procedure were                         verified by the physician, the nurse, the                         anesthesiologist, the anesthetist and the technician                         in the pre-procedure area in the procedure room in the                         endoscopy suite. Mental Status Examination: alert and                         oriented. Airway Examination: normal oropharyngeal                         airway and neck mobility. Respiratory Examination:                         clear to auscultation. CV Examination: normal.  Prophylactic Antibiotics: The patient does not require                         prophylactic antibiotics. Prior Anticoagulants: The                         patient has taken no  previous anticoagulant or                         antiplatelet agents. ASA Grade Assessment: II - A                         patient with mild systemic disease. After reviewing                         the risks and benefits, the patient was deemed in                         satisfactory condition to undergo the procedure. The                         anesthesia plan was to use general anesthesia.                         Immediately prior to administration of medications,                         the patient was re-assessed for adequacy to receive                         sedatives. The heart rate, respiratory rate, oxygen                         saturations, blood pressure, adequacy of pulmonary                         ventilation, and response to care were monitored                         throughout the procedure. The physical status of the                         patient was re-assessed after the procedure.                        After obtaining informed consent, the colonoscope was                         passed under direct vision. Throughout the procedure,                         the patient's blood pressure, pulse, and oxygen                         saturations were monitored continuously. The                         Colonoscope was introduced through the anus and  advanced to the the cecum, identified by appendiceal                         orifice and ileocecal valve. The colonoscopy was                         performed without difficulty. The patient tolerated                         the procedure well. The quality of the bowel                         preparation was evaluated using the BBPS [facility] Bowel                         Preparation Scale) with scores of: Right Colon = 3,                         Transverse Colon = 3 and Left Colon = 3 (entire mucosa                         seen well with no residual staining, small fragments                         of  stool or opaque liquid). The total BBPS score                         equals 9. Findings:      The perianal and digital rectal examinations were normal. Pertinent       negatives include normal sphincter tone and no palpable rectal lesions.      Skin tags were found on perianal exam.      A 5 mm polyp was found in the rectum. The polyp was sessile. The polyp       was removed with a cold snare. Resection and retrieval were complete.       Estimated blood loss: none.      The retroflexed view of the distal rectum and anal verge was normal and       showed no anal or rectal abnormalities.      The exam was otherwise without abnormality. Impression:            - Perianal skin tags found on perianal exam.                        - One 5 mm polyp in the rectum, removed with a cold                         snare. Resected and retrieved.                        - The distal rectum and anal verge are normal on                         retroflexion view.                        - The examination was otherwise normal. Recommendation:        -  Discharge patient to home (with escort).                        - Resume previous diet today.                        - Continue present medications.                        - Await pathology results.                        - Repeat colonoscopy in 7 years for surveillance based                         on pathology results. Procedure Code(s):     --- Professional ---                        [phone], Colonoscopy, flexible; with removal of                         tumor(s), polyp(s), or other lesion(s) by snare                         technique Diagnosis Code(s):     --- Professional ---                        Z12.11, Encounter for screening for malignant neoplasm                         of colon                        K62.1, Rectal polyp                        K64.4, Residual hemorrhoidal skin tags CPT copyright 2019 American Medical Association. All rights  reserved. The codes documented in this report are preliminary and upon coder review may  be revised to meet current compliance requirements. Dr. Ulyess Mort Lin Landsman MD, MD 10/04/2020 11:48:26 AM This report has been signed electronically. Number of Addenda: 0 Note Initiated On: 10/04/2020 11:16 AM Scope Withdrawal Time: 0 hours 10 minutes 18 seconds  Total Procedure Duration: 0 hours 12 minutes 2 seconds  Estimated Blood Loss:  Estimated blood loss: none.      [facility]

## 2020-10-04 NOTE — Anesthesia Preprocedure Evaluation (Signed)
Anesthesia Evaluation  Patient identified by MRN, date of birth, ID band Patient awake    Reviewed: Allergy & Precautions, NPO status , Patient's Chart, lab work & pertinent test results  History of Anesthesia Complications Negative for: history of anesthetic complications  Airway Mallampati: III  TM Distance: >3 FB Neck ROM: Full    Dental no notable dental hx. (+) Teeth Intact, Dental Advisory Given   Pulmonary neg shortness of breath, sleep apnea , neg COPD, neg recent URI, Patient abstained from smoking.Not current smoker,  Possible OSA - patient says she has been observed to have apneic episodes, has not had a formal sleep study yet   Pulmonary exam normal breath sounds clear to auscultation       Cardiovascular Exercise Tolerance: Good METShypertension, (-) angina(-) CAD, (-) Past MI and (-) Cardiac Stents negative cardio ROS  (-) dysrhythmias (-) Valvular Problems/Murmurs Rhythm:Regular Rate:Normal - Systolic murmurs    Neuro/Psych negative neurological ROS  negative psych ROS   GI/Hepatic neg GERD  ,(+)     (-) substance abuse  ,   Endo/Other  neg diabetes  Renal/GU negative Renal ROS     Musculoskeletal  (+) Arthritis ,   Abdominal   Peds  Hematology   Anesthesia Other Findings Past Medical History: No date: Allergy No date: Mixed hyperlipidemia No date: Nut allergy     Comment:  [place] nut  Reproductive/Obstetrics                             Anesthesia Physical  Anesthesia Plan  ASA: II  Anesthesia Plan: General   Post-op Pain Management:    Induction: Intravenous  PONV Risk Score and Plan: 4 or greater and Propofol infusion and TIVA  Airway Management Planned: Natural Airway and Nasal Cannula  Additional Equipment:   Intra-op Plan:   Post-operative Plan: Extubation in OR  Informed Consent: I have reviewed the patients History and Physical, chart, labs and  discussed the procedure including the risks, benefits and alternatives for the proposed anesthesia with the patient or authorized representative who has indicated his/her understanding and acceptance.     Dental advisory given  Plan Discussed with: CRNA and Surgeon  Anesthesia Plan Comments: (Discussed risks of anesthesia with patient, including PONV, sore throat, lip/dental damage. Rare risks discussed as well, such as cardiorespiratory and neurological sequelae. Patient understands. Discussed possible arterial line as well.)        Anesthesia Quick Evaluation

## 2020-10-04 NOTE — Anesthesia Postprocedure Evaluation (Signed)
Anesthesia Post Note  Patient: Beverly Hughes  Procedure(s) Performed: COLONOSCOPY WITH PROPOFOL (N/A )  Patient location during evaluation: Endoscopy Anesthesia Type: General Level of consciousness: awake and alert Pain management: pain level controlled Vital Signs Assessment: post-procedure vital signs reviewed and stable Respiratory status: spontaneous breathing, nonlabored ventilation, respiratory function stable and patient connected to nasal cannula oxygen Cardiovascular status: blood pressure returned to baseline and stable Postop Assessment: no apparent nausea or vomiting Anesthetic complications: no   No complications documented.   Last Vitals:  Vitals:   10/04/20 1151 10/04/20 1201  BP:  (!) 161/98  Pulse:    Resp:    Temp: 36.8 C   SpO2:      Last Pain:  Vitals:   10/04/20 1211  TempSrc:   PainSc: 0-No pain                 Martha Clan

## 2020-10-05 ENCOUNTER — Encounter: Payer: Self-pay | Admitting: Gastroenterology

## 2020-10-05 LAB — SURGICAL PATHOLOGY

## 2020-10-06 ENCOUNTER — Encounter: Payer: Self-pay | Admitting: Family

## 2020-10-08 NOTE — Telephone Encounter (Signed)
Liver function normal.   Cholesterol normal.   Your hemoglobin A1c is consistent with pre-diabetes. Practice healthy eating habits of fresh fruit and vegetables, lean baked meats such as chicken, fish, and [place]; limit breads, rice, pastas, and desserts; practice regular aerobic exercise (at least 150 minutes a week as tolerated). Patient encouraged to schedule appointment to have hemoglobin A1c rechecked in 6 months or sooner if needed.   Regarding sleep apnea the ordering physician of the sleep study would be a resource for additional information concerning this matter.

## 2020-10-11 ENCOUNTER — Telehealth: Payer: Self-pay | Admitting: Gastroenterology

## 2020-10-11 NOTE — Telephone Encounter (Signed)
Read patient the letter that was sent to patient mychart. Went over the letter with patient and answered her questions

## 2020-10-11 NOTE — Telephone Encounter (Signed)
Patient calling for pathology results.

## 2020-10-29 ENCOUNTER — Other Ambulatory Visit: Payer: Self-pay | Admitting: Otolaryngology

## 2020-10-29 DIAGNOSIS — R221 Localized swelling, mass and lump, neck: Secondary | ICD-10-CM

## 2020-11-12 ENCOUNTER — Other Ambulatory Visit: Payer: Self-pay | Admitting: Family Medicine

## 2020-11-12 ENCOUNTER — Other Ambulatory Visit: Payer: 59

## 2020-12-09 ENCOUNTER — Inpatient Hospital Stay: Admission: RE | Admit: 2020-12-09 | Payer: 59 | Source: Ambulatory Visit

## 2021-02-09 ENCOUNTER — Encounter: Payer: 59 | Admitting: Family

## 2021-02-09 NOTE — Progress Notes (Signed)
Patient did not show for appointment.   

## 2021-02-17 DIAGNOSIS — R7989 Other specified abnormal findings of blood chemistry: Secondary | ICD-10-CM | POA: Insufficient documentation

## 2021-02-17 DIAGNOSIS — R7303 Prediabetes: Secondary | ICD-10-CM | POA: Insufficient documentation

## 2021-02-17 DIAGNOSIS — E559 Vitamin D deficiency, unspecified: Secondary | ICD-10-CM | POA: Insufficient documentation

## 2021-09-26 ENCOUNTER — Other Ambulatory Visit: Payer: Self-pay

## 2021-09-26 ENCOUNTER — Ambulatory Visit (HOSPITAL_COMMUNITY)
Admission: EM | Admit: 2021-09-26 | Discharge: 2021-09-26 | Disposition: A | Discharge status: Home / Self Care | Payer: 59 | Attending: Emergency Medicine | Admitting: Emergency Medicine

## 2021-09-26 ENCOUNTER — Encounter (HOSPITAL_COMMUNITY): Payer: Self-pay | Admitting: *Deleted

## 2021-09-26 DIAGNOSIS — L259 Unspecified contact dermatitis, unspecified cause: Secondary | ICD-10-CM

## 2021-09-26 DIAGNOSIS — L509 Urticaria, unspecified: Secondary | ICD-10-CM

## 2021-09-26 DIAGNOSIS — R21 Rash and other nonspecific skin eruption: Secondary | ICD-10-CM

## 2021-09-26 MED ORDER — HYDROXYZINE HCL 25 MG PO TABS: 25.0000 mg | ORAL_TABLET | Freq: Four times a day (QID) | ORAL | 0 refills | Status: DC

## 2021-09-26 MED ORDER — BETAMETHASONE DIPROPIONATE 0.05 % EX OINT: TOPICAL_OINTMENT | Freq: Two times a day (BID) | CUTANEOUS | 0 refills | Status: DC

## 2021-09-26 MED ORDER — PREDNISONE 10 MG PO TABS: 20.0000 mg | ORAL_TABLET | Freq: Every day | ORAL | 0 refills | Status: DC

## 2021-09-26 NOTE — ED Triage Notes (Signed)
Pt reports blisters to multiple areas of body for one week. Pt reports rash itches and burns. ?

## 2021-09-26 NOTE — ED Provider Notes (Signed)
?[place] ? ? ? ?CSN: [id number] ?Arrival date & time: 09/26/21  0807 ? ? ?  ? ?History   ?Chief Complaint ?Chief Complaint  ?Patient presents with  ? Rash  ? ? ?HPI ?Beverly Hughes is a 56 y.o. female.  ? ?Patient presents with a rash with small bumps throughout several areas of the body for over a week after receiving a pedicure.  Patient states the areas seem to be like small bumps and very itchy.  Patient states that she does have a little athletes feet that she has been treating for a long period of time now.  Has used metronidazole cream on the area prior to arrival with no change.  Denies any known illness no fevers. ? ? ?Past Medical History:  ?Diagnosis Date  ? Allergy   ? Arthritis   ? Phreesia 08/20/2020  ? Hypertension   ? Phreesia 08/20/2020  ? Mixed hyperlipidemia   ? Nut allergy   ? [place] nut  ? ? ?Patient Active Problem List  ? Diagnosis Date Noted  ? Encounter for screening colonoscopy   ? Hypertensive disorder 08/20/2020  ? S/P cervical spinal fusion 10/15/2019  ? Degenerative disc disease, cervical 05/01/2019  ? Spinal stenosis of cervical region 03/05/2019  ? Cervical cord myelomalacia ([place]) 01/14/2019  ? ? ?Past Surgical History:  ?Procedure Laterality Date  ? ANTERIOR CERVICAL CORPECTOMY N/A 10/15/2019  ? Procedure: ANTERIOR CERVICAL CORPECTOMY C5;  Surgeon: Meade Maw, MD;  Location: [facility] ORS;  Service: Neurosurgery;  Laterality: N/A;  ? ANTERIOR CERVICAL DECOMP/DISCECTOMY FUSION N/A 10/15/2019  ? Procedure: C6-7 ANTERIOR CERVICAL DISCECTOMY AND FUSION, C4-7 ANTERIOR SPINAL INSTRUMENTATION;  Surgeon: Meade Maw, MD;  Location: [facility] ORS;  Service: Neurosurgery;  Laterality: N/A;  ? COLONOSCOPY WITH PROPOFOL N/A 10/04/2020  ? Procedure: COLONOSCOPY WITH PROPOFOL;  Surgeon: Lin Landsman, MD;  Location: [facility] ENDOSCOPY;  Service: Gastroenterology;  Laterality: N/A;  ? EYE SURGERY    ? tear duct repair  ? FRACTURE SURGERY    ? left foot fracture at 56 yrs old  ?  TUBAL LIGATION    ? ? ?OB History   ?No obstetric history on file. ?  ? ? ? ?Home Medications   ? ?Prior to Admission medications   ?Medication Sig Start Date End Date Taking? Authorizing Provider  ?betamethasone dipropionate (DIPROLENE) 0.05 % ointment Apply topically 2 (two) times daily. 09/26/21  Yes Marney Setting, NP  ?hydrOXYzine (ATARAX) 25 MG tablet Take 1 tablet (25 mg total) by mouth every 6 (six) hours. 09/26/21  Yes Marney Setting, NP  ?predniSONE (DELTASONE) 10 MG tablet Take 2 tablets (20 mg total) by mouth daily. 09/26/21  Yes Marney Setting, NP  ?Ascorbic Acid (VITAMIN C PO) Take by mouth.    [provider]  ?Calcium-Magnesium-Zinc (CALCIUM-MAGNESUIUM-ZINC PO) Take 1 tablet by mouth in the morning, at noon, and at bedtime.    [provider]  ?cholecalciferol (VITAMIN D3) 25 MCG (1000 UNIT) tablet Take 1,000 Units by mouth daily.    [provider]  ?fluticasone (FLONASE) 50 MCG/ACT nasal spray Place 2 sprays into both nostrils daily. 09/25/20 09/25/21  Johnn Hai, PA-C  ?methocarbamol (ROBAXIN) 500 MG tablet Take 1 tablet (500 mg total) by mouth every 8 (eight) hours as needed for muscle spasms. 09/06/20   Volney American, PA-C  ?Multiple Vitamins-Minerals (ZINC PO) Take by mouth.    [provider]  ?omeprazole (PRILOSEC) 20 MG capsule omeprazole 20 mg capsule,delayed release  [provider]  ?traMADol (ULTRAM) 50 MG tablet Take 50 mg by mouth every 6 (six) hours as needed.    [provider]  ? ? ?Family History ?Family History  ?Problem Relation Age of Onset  ? Diabetes Mother   ? Kidney Stones Mother   ? Heart disease Mother   ? Cancer Father   ? Prostate cancer Father   ? Diabetes Maternal Grandmother   ? Heart disease Maternal Grandmother   ? Hypertension Maternal Grandmother   ? Diabetes Maternal Grandfather   ? Heart disease Maternal Grandfather   ? Hypertension Maternal Grandfather   ? Colon cancer Paternal  Grandmother   ? Hypertension Paternal Grandmother   ? ? ?Social History ?Social History  ? ?Tobacco Use  ? Smoking status: Never  ? Smokeless tobacco: Never  ?Vaping Use  ? Vaping Use: Never used  ?Substance Use Topics  ? Alcohol use: Yes  ?  Alcohol/week: 2.0 standard drinks  ?  Types: 1 Cans of beer, 1 Glasses of wine per week  ?  Comment: socially  ? Drug use: No  ? ? ? ?Allergies   ?Codeine, Other, and Codeine sulfate ? ? ?Review of Systems ?Review of Systems  ?Constitutional:  Negative for fever.  ?Respiratory: Negative.    ?Cardiovascular: Negative.   ?Gastrointestinal: Negative.   ?Skin:  Positive for rash. Negative for wound.  ?     Slight raised areas throughout the back bilateral arms feet shoulder blades  ?Neurological: Negative.   ? ? ?Physical Exam ?Triage Vital Signs ?ED Triage Vitals  ?Enc Vitals Group  ?   BP 09/26/21 0840 120/83  ?   Pulse Rate 09/26/21 0840 80  ?   Resp 09/26/21 0840 18  ?   Temp 09/26/21 0840 98 ?F (36.7 ?C)  ?   Temp src --   ?   SpO2 09/26/21 0840 99 %  ?   Weight --   ?   Height --   ?   Head Circumference --   ?   Peak Flow --   ?   Pain Score 09/26/21 0837 6  ?   Pain Loc --   ?   Pain Edu? --   ?   Excl. in [place]? --   ? ?No data found. ? ?Updated Vital Signs ?BP 120/83   Pulse 80   Temp 98 ?F (36.7 ?C)   Resp 18   SpO2 99%  ? ?Visual Acuity ?Right Eye Distance:   ?Left Eye Distance:   ?Bilateral Distance:   ? ?Right Eye Near:   ?Left Eye Near:    ?Bilateral Near:    ? ?Physical Exam ?Constitutional:   ?   Appearance: Normal appearance.  ?Cardiovascular:  ?   Rate and Rhythm: Normal rate.  ?   Pulses: Normal pulses.  ?Pulmonary:  ?   Effort: Pulmonary effort is normal.  ?Abdominal:  ?   General: Abdomen is flat.  ?Musculoskeletal:     ?   General: Normal range of motion.  ?Skin: ?   Findings: Rash present.  ?   Comments: Slight patches of raised nodules to back of arms bilateral upper back and bilateral feet.  With slight erythema from scratching around area.  No signs of  blisters.  Does have some pain diagnosis between first and second digits to toes.  ?Neurological:  ?   Mental Status: She is alert.  ? ? ? ?UC Treatments / Results  ?Labs ?(all labs ordered are  listed, but only abnormal results are displayed) ?Labs Reviewed - No data to display ? ?EKG ? ? ?Radiology ?No results found. ? ?Procedures ?Procedures (including critical care time) ? ?Medications Ordered in UC ?Medications - No data to display ? ?Initial Impression / Assessment and Plan / UC Course  ?I have reviewed the triage vital signs and the nursing notes. ? ?Pertinent labs & imaging results that were available during my care of the patient were reviewed by me and considered in my medical decision making (see chart for details). ? ?  ? ?This appears to be more of a contact dermatitis from an unknown cause. ?Take the anti-itch as needed caution when taking this can make you sleepy ?Can use a topical Benadryl or hydrocortisone cream as needed for itching ?If the symptoms persist for greater than a week to 2 weeks you may need to see dermatology ?Apply cream to areas twice a day wash dry hands well to prevent any possible spreading ?Final Clinical Impressions(s) / UC Diagnoses  ? ?Final diagnoses:  ?Rash  ?Contact dermatitis, unspecified contact dermatitis type, unspecified trigger  ?Urticaria  ? ?Discharge Instructions   ?None ?  ? ?ED Prescriptions   ? ? Medication Sig Dispense Auth. Provider  ? predniSONE (DELTASONE) 10 MG tablet Take 2 tablets (20 mg total) by mouth daily. 15 tablet Morley Kos L, NP  ? hydrOXYzine (ATARAX) 25 MG tablet Take 1 tablet (25 mg total) by mouth every 6 (six) hours. 12 tablet Morley Kos L, NP  ? betamethasone dipropionate (DIPROLENE) 0.05 % ointment Apply topically 2 (two) times daily. 30 g Marney Setting, NP  ? ?  ? ?PDMP not reviewed this encounter. ?  ?Marney Setting, NP ?09/26/21 [phone] ? ?

## 2021-09-28 NOTE — Progress Notes (Signed)
Erroneous encounter

## 2021-09-30 ENCOUNTER — Encounter (HOSPITAL_COMMUNITY): Payer: Self-pay

## 2021-09-30 ENCOUNTER — Other Ambulatory Visit: Payer: Self-pay

## 2021-09-30 ENCOUNTER — Emergency Department (HOSPITAL_COMMUNITY)
Admission: EM | Admit: 2021-09-30 | Discharge: 2021-10-01 | Disposition: A | Discharge status: Home / Self Care | Payer: 59 | Attending: Emergency Medicine | Admitting: Emergency Medicine

## 2021-09-30 DIAGNOSIS — S199XXA Unspecified injury of neck, initial encounter: Secondary | ICD-10-CM | POA: Diagnosis present

## 2021-09-30 DIAGNOSIS — S0990XA Unspecified injury of head, initial encounter: Secondary | ICD-10-CM | POA: Insufficient documentation

## 2021-09-30 DIAGNOSIS — S39012A Strain of muscle, fascia and tendon of lower back, initial encounter: Secondary | ICD-10-CM | POA: Insufficient documentation

## 2021-09-30 DIAGNOSIS — R079 Chest pain, unspecified: Secondary | ICD-10-CM | POA: Diagnosis not present

## 2021-09-30 DIAGNOSIS — S161XXA Strain of muscle, fascia and tendon at neck level, initial encounter: Secondary | ICD-10-CM | POA: Insufficient documentation

## 2021-09-30 DIAGNOSIS — Y92410 Unspecified street and highway as the place of occurrence of the external cause: Secondary | ICD-10-CM | POA: Diagnosis not present
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
## 2021-10-01 ENCOUNTER — Emergency Department (HOSPITAL_COMMUNITY): Payer: 59

## 2021-10-01 MED ORDER — CYCLOBENZAPRINE HCL 10 MG PO TABS: 10.0000 mg | ORAL_TABLET | Freq: Three times a day (TID) | ORAL | 0 refills | Status: DC | PRN

## 2021-10-01 MED ORDER — CYCLOBENZAPRINE HCL 10 MG PO TABS
10.0000 mg | ORAL_TABLET | Freq: Once | ORAL | Status: AC
  Administered 2021-10-01: 10 mg via ORAL
  Filled 2021-10-01: qty 1
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
## 2021-10-03 ENCOUNTER — Encounter: Payer: 59 | Admitting: Family

## 2021-10-03 DIAGNOSIS — Z130 Encounter for screening for diseases of the blood and blood-forming organs and certain disorders involving the immune mechanism: Secondary | ICD-10-CM

## 2021-10-03 DIAGNOSIS — Z13220 Encounter for screening for lipoid disorders: Secondary | ICD-10-CM

## 2021-10-03 DIAGNOSIS — Z1329 Encounter for screening for other suspected endocrine disorder: Secondary | ICD-10-CM

## 2021-10-03 DIAGNOSIS — Z0000 Encounter for general adult medical examination without abnormal findings: Secondary | ICD-10-CM

## 2021-10-03 DIAGNOSIS — Z1231 Encounter for screening mammogram for malignant neoplasm of breast: Secondary | ICD-10-CM

## 2021-10-03 DIAGNOSIS — Z13228 Encounter for screening for other metabolic disorders: Secondary | ICD-10-CM

## 2021-10-03 DIAGNOSIS — Z131 Encounter for screening for diabetes mellitus: Secondary | ICD-10-CM

## 2021-10-07 ENCOUNTER — Emergency Department (HOSPITAL_COMMUNITY)
Admission: EM | Admit: 2021-10-07 | Discharge: 2021-10-08 | Disposition: A | Discharge status: Home / Self Care | Payer: 59 | Attending: Emergency Medicine | Admitting: Emergency Medicine

## 2021-10-07 DIAGNOSIS — S299XXD Unspecified injury of thorax, subsequent encounter: Secondary | ICD-10-CM | POA: Diagnosis not present

## 2021-10-07 DIAGNOSIS — G44319 Acute post-traumatic headache, not intractable: Secondary | ICD-10-CM | POA: Insufficient documentation

## 2021-10-07 DIAGNOSIS — S169XXD Unspecified injury of muscle, fascia and tendon at neck level, subsequent encounter: Secondary | ICD-10-CM | POA: Insufficient documentation

## 2021-10-07 DIAGNOSIS — Y92410 Unspecified street and highway as the place of occurrence of the external cause: Secondary | ICD-10-CM | POA: Diagnosis not present
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
## 2021-10-08 ENCOUNTER — Emergency Department (HOSPITAL_COMMUNITY): Payer: 59

## 2021-10-08 MED ORDER — METHOCARBAMOL 500 MG PO TABS: 500.0000 mg | ORAL_TABLET | Freq: Two times a day (BID) | ORAL | 0 refills | Status: DC

## 2021-10-08 NOTE — Discharge Instructions (Signed)
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
## 2021-10-10 ENCOUNTER — Ambulatory Visit: Payer: Self-pay | Admitting: *Deleted

## 2021-10-10 ENCOUNTER — Telehealth: Payer: Self-pay | Admitting: Family
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
## 2021-10-14 ENCOUNTER — Ambulatory Visit (INDEPENDENT_AMBULATORY_CARE_PROVIDER_SITE_OTHER): Payer: 59 | Admitting: Family

## 2021-10-14 VITALS — BP 135/82 | HR 73 | Temp 98.5°F | Resp 18 | Wt 193.0 lb

## 2021-10-14 DIAGNOSIS — Z981 Arthrodesis status: Secondary | ICD-10-CM

## 2021-10-14 DIAGNOSIS — Z0289 Encounter for other administrative examinations: Secondary | ICD-10-CM

## 2021-10-14 DIAGNOSIS — Z09 Encounter for follow-up examination after completed treatment for conditions other than malignant neoplasm: Secondary | ICD-10-CM | POA: Diagnosis not present

## 2021-10-14 DIAGNOSIS — G44319 Acute post-traumatic headache, not intractable: Secondary | ICD-10-CM

## 2021-10-14 DIAGNOSIS — M546 Pain in thoracic spine: Secondary | ICD-10-CM

## 2021-10-14 MED ORDER — SUMATRIPTAN SUCCINATE 25 MG PO TABS: ORAL_TABLET | ORAL | 1 refills | Status: DC
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
## 2021-10-14 NOTE — Patient Instructions (Signed)
Concussion, Adult ?A concussion is a brain injury from a hard, direct hit (trauma) to the head or body. This direct hit causes the brain to shake quickly back and forth inside the skull. This can damage brain cells and cause chemical changes in the brain. A concussion may also be known as a mild traumatic brain injury (TBI). ?Concussions are usually not life-threatening, but the effects of a concussion can be serious. If you have a concussion, you should be very careful to avoid having a second concussion. ?What are the causes? ?This condition is caused by: ?A direct hit to your head, such as: ?Running into another player during a game. ?Being hit in a fight. ?Hitting your head on a hard surface. ?Sudden movement of your body that causes your brain to move back and forth inside the skull, such as in a car crash. ?What are the signs or symptoms? ?The signs of a concussion can be hard to notice. Early on, they may be missed by you, family members, and health care providers. You may look fine on the outside but may act or feel differently. ?Every head injury is different. Symptoms are usually temporary but may last for days, weeks, or even months. Some symptoms appear right away, but other symptoms may not show up for hours or days. If your symptoms last longer than normal, you may have post-concussion syndrome. ?Physical symptoms ?Headaches. ?Dizziness and problems with coordination or balance. ?Sensitivity to light or noise. ?Nausea or vomiting. ?Tiredness (fatigue). ?Vision or hearing problems. ?Changes in eating or sleeping patterns. ?Seizure. ?Mental and emotional symptoms ?Irritability or mood changes. ?Memory problems. ?Trouble concentrating, organizing, or making decisions. ?Slowness in thinking, acting or reacting, speaking, or reading. ?Anxiety or depression. ?How is this diagnosed? ?This condition is diagnosed based on: ?Your symptoms. ?A description of your injury. ?You may also have tests,  including: ?Imaging tests, such as a CT scan or an MRI. ?Neuropsychological tests. These measure your thinking, understanding, learning, and remembering abilities. ?How is this treated? ?Treatment for this condition includes: ?Stopping sports or activity if you are injured. If you hit your head or show signs of concussion: ?Do not return to sports or activities the same day. ?Get checked by a health care provider before you return to your activities. ?Physical and mental rest and careful observation, usually at home. Gradually return to your normal activities. ?Medicines to help with symptoms such as headaches, nausea, or difficulty sleeping. ?Avoid taking opioid pain medicine while recovering from a concussion. ?Avoiding alcohol and drugs. These may slow your recovery and can put you at risk of further injury. ?Referral to a concussion clinic or rehabilitation center. ?Recovery from a concussion can take time. How fast you recover depends on many factors. Return to activities only when: ?Your symptoms are completely gone. ?Your health care provider says that it is safe. ?Follow these instructions at home: ?Activity ?Limit activities that require a lot of thought or concentration, such as: ?Doing homework or job-related work. ?Watching TV. ?Working on the computer or phone. ?Playing memory games and puzzles. ?Rest. Rest helps your brain heal. Make sure you: ?Get plenty of sleep. Most adults should get 7-9 hours of sleep each night. ?Rest during the day. Take naps or rest breaks when you feel tired. ?Avoid physical activity like exercise until your health care provider says it is safe. Stop any activity that worsens symptoms. ?Do not do high-risk activities that could cause a second concussion, such as riding a bike or playing sports. ?  Ask your health care provider when you can return to your normal activities, such as school, work, athletics, and driving. Your ability to react may be slower after a brain injury.  Never do these activities if you are dizzy. Your health care provider will likely give you a plan for gradually returning to activities. ?General instructions ? ?Take over-the-counter and prescription medicines only as told by your health care provider. Some medicines, such as blood thinners (anticoagulants) and aspirin, may increase the risk for complications, such as bleeding. ?Do not drink alcohol until your health care provider says you can. ?Watch your symptoms and tell others around you to do the same. Complications sometimes occur after a concussion. Older adults with a brain injury may have a higher risk of serious complications. ?Tell your work Freight forwarder, teachers, Government social research officer, school counselor, coach, or Product/process development scientist about your injury, symptoms, and restrictions. ?Keep all follow-up visits as told by your health care provider. This is important. ?How is this prevented? ?Avoiding another brain injury is very important. In rare cases, another injury can lead to permanent brain damage, brain swelling, or death. The risk of this is greatest during the first 7-10 days after a head injury. Avoid injuries by: ?Stopping activities that could lead to a second concussion, such as contact or recreational sports, until your health care provider says it is okay. ?Taking these actions once you have returned to sports or activities: ?Avoiding plays or moves that can cause you to crash into another person. This is how most concussions occur. ?Following the rules and being respectful of other players. Do not engage in violent or illegal plays. ?Getting regular exercise that includes strength and balance training. ?Wearing a properly fitting helmet during sports, biking, or other activities. Helmets can help protect you from serious skull and brain injuries, but they may not protect you from a concussion. Even when wearing a helmet, you should avoid being hit in the head. ?Contact a health care provider if: ?Your  symptoms do not improve. ?You have new symptoms. ?You have another injury. ?Get help right away if: ?You have new or worsening physical symptoms, such as: ?A severe or worsening headache. ?Weakness or numbness in any part of your body, slurred speech, vision changes, or confusion. ?Your coordination gets worse. ?Vomiting repeatedly. ?You have a seizure. ?You have unusual behavior changes. ?You lose consciousness, are sleepier than normal, or are difficult to wake up. ?These symptoms may represent a serious problem that is an emergency. Do not wait to see if the symptoms will go away. Get medical help right away. Call your local emergency services (911 in the [place] Do not drive yourself to the hospital. ?Summary ?A concussion is a brain injury that results from a hard, direct hit (trauma) to your head or body. ?You may have imaging tests and neuropsychological tests to diagnose a concussion. ?Treatment for this condition includes physical and mental rest and careful observation. ?Ask your health care provider when you can return to your normal activities, such as school, work, athletics, and driving. ?Get help right away if you have a severe headache, weakness in any part of the body, seizures, behavior changes, changes in vision, or if you are confused or sleepier than normal. ?This information is not intended to replace advice given to you by your health care provider. Make sure you discuss any questions you have with your health care provider. ?Document Revised: 09/16/2020 Document Reviewed: 09/16/2020 ?Elsevier Patient Education ? [place]. ? ?

## 2021-12-02 ENCOUNTER — Ambulatory Visit (HOSPITAL_COMMUNITY)
Admission: EM | Admit: 2021-12-02 | Discharge: 2021-12-02 | Disposition: A | Discharge status: Home / Self Care | Payer: 59

## 2021-12-02 ENCOUNTER — Emergency Department (HOSPITAL_COMMUNITY): Payer: 59

## 2021-12-02 ENCOUNTER — Other Ambulatory Visit: Payer: Self-pay

## 2021-12-02 ENCOUNTER — Emergency Department (HOSPITAL_COMMUNITY)
Admission: EM | Admit: 2021-12-02 | Discharge: 2021-12-03 | Disposition: A | Discharge status: Home / Self Care | Payer: 59 | Attending: Emergency Medicine | Admitting: Emergency Medicine

## 2021-12-02 ENCOUNTER — Encounter (HOSPITAL_COMMUNITY): Payer: Self-pay | Admitting: Emergency Medicine

## 2021-12-02 DIAGNOSIS — N644 Mastodynia: Secondary | ICD-10-CM | POA: Insufficient documentation

## 2021-12-02 DIAGNOSIS — L259 Unspecified contact dermatitis, unspecified cause: Secondary | ICD-10-CM | POA: Diagnosis not present

## 2021-12-02 LAB — CBC
HCT: 40.4 % (ref 36.0–46.0)
Hemoglobin: 12.8 g/dL (ref 12.0–15.0)
MCH: 28.8 pg (ref 26.0–34.0)
MCHC: 31.7 g/dL (ref 30.0–36.0)
MCV: 91 fL (ref 80.0–100.0)
Platelets: 237 10*3/uL (ref 150–400)
RBC: 4.44 MIL/uL (ref 3.87–5.11)
RDW: 13 % (ref 11.5–15.5)
WBC: 5.1 10*3/uL (ref 4.0–10.5)
nRBC: 0 % (ref 0.0–0.2)

## 2021-12-02 LAB — BASIC METABOLIC PANEL
Anion gap: 8 (ref 5–15)
BUN: 8 mg/dL (ref 6–20)
CO2: 26 mmol/L (ref 22–32)
Calcium: 9 mg/dL (ref 8.9–10.3)
Chloride: 105 mmol/L (ref 98–111)
Creatinine, Ser: 0.61 mg/dL (ref 0.44–1.00)
GFR, Estimated: >60 mL/min (ref >60)
Glucose, Bld: 99 mg/dL (ref 70–99)
Potassium: 3.8 mmol/L (ref 3.5–5.1)
Sodium: 139 mmol/L (ref 135–145)

## 2021-12-02 LAB — TROPONIN I (HIGH SENSITIVITY)
Troponin I (High Sensitivity): 3 ng/L (ref <18)
Troponin I (High Sensitivity): 3 ng/L (ref <18)
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
## 2021-12-03 NOTE — Discharge Instructions (Signed)
Use claritin or zyrtec daily
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
## 2022-01-03 ENCOUNTER — Ambulatory Visit: Payer: 59 | Admitting: Physician Assistant

## 2022-01-03 ENCOUNTER — Ambulatory Visit: Payer: Self-pay

## 2022-01-03 ENCOUNTER — Encounter: Payer: Self-pay | Admitting: Physician Assistant

## 2022-01-03 VITALS — BP 126/80 | HR 76 | Resp 18 | Ht 64.0 in | Wt 193.4 lb

## 2022-01-03 DIAGNOSIS — R0789 Other chest pain: Secondary | ICD-10-CM

## 2022-01-03 DIAGNOSIS — J302 Other seasonal allergic rhinitis: Secondary | ICD-10-CM

## 2022-01-03 MED ORDER — FLUTICASONE PROPIONATE 50 MCG/ACT NA SUSP: 2.0000 | Freq: Every day | NASAL | 0 refills | Status: DC

## 2022-01-03 MED ORDER — PREDNISONE 20 MG PO TABS: ORAL_TABLET | ORAL | 0 refills | Status: AC
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
## 2022-01-03 NOTE — Telephone Encounter (Signed)
  Chief Complaint: chest pain Symptoms: chest pain, pain with deep breath, cough- heavy mucus Frequency: ongoing- getting worse Pertinent Negatives: Patient denies fever Disposition: '[]'$ ED /'[]'$ Urgent Care (no appt availability in office) / '[]'$ Appointment(In office/virtual)/ '[]'$  [facility]/ '[]'$ Home Care/ '[]'$ Refused Recommended Disposition /'[x]'$ [facility]/ '[]'$  Follow-up with PCP Additional Notes: Patient advised of her options for care- ED for her pain- she does not want to go- she was there last month. Patient advised she needs to be seen- she agrees to go to mobile unit at this time- although she may change her mind and go to ED.  Reason for Disposition  Taking a deep breath makes pain worse  Protocols used: Chest Pain-A-AH
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
## 2022-01-03 NOTE — Patient Instructions (Addendum)
You are going to take a steroid taper, I encourage you to make sure that you are staying very well-hydrated during your taper.  I strongly encourage you to make sure that you follow-up with your OB/GYN or your primary care provider for your annual physical.  I have started a referral for you to be seen by cardiology per your request.  I hope that you feel better soon, please let us know if there is anything else we can do for you  Kennieth Rad, PA-C Physician Assistant [facility] [email or web address]   Costochondritis  Costochondritis is inflammation of the tissue (cartilage) that connects the ribs to the breastbone (sternum). This causes pain in the front of the chest. The pain usually starts slowly and involves more than one rib. What are the causes? The exact cause of this condition is not always known. It results from stress on the cartilage where your ribs attach to your sternum. The cause of this stress could be: Chest injury. Exercise or activity, such as lifting. Severe coughing. What increases the risk? You are more likely to develop this condition if you: Are female. Are 91-89 years old. Recently started a new exercise or work activity. Have low levels of vitamin D. Have a condition that makes you cough frequently. What are the signs or symptoms? The main symptom of this condition is chest pain. The pain: Usually starts gradually and can be sharp or dull. Gets worse with deep breathing, coughing, or exercise. Gets better with rest. May be worse when you press on the affected area of your ribs and sternum. How is this diagnosed? This condition is diagnosed based on your symptoms, your medical history, and a physical exam. Your health care provider will check for pain when pressing on your sternum. You may also have tests to rule out other causes of chest pain. These may include: A chest X-ray to check for lung  problems. An ECG (electrocardiogram) to see if you have a heart problem that could be causing the pain. An imaging scan to rule out a chest or rib fracture. How is this treated? This condition usually goes away on its own over time. Your health care provider may prescribe an NSAID, such as ibuprofen, to reduce pain and inflammation. Treatment may also include: Resting and avoiding activities that make pain worse. Applying heat or ice to the area to reduce pain and inflammation. Doing exercises to stretch your chest muscles. If these treatments do not help, your health care provider may inject a numbing medicine at the sternum-rib connection to help relieve the pain. Follow these instructions at home: Managing pain, stiffness, and swelling     If directed, put ice on the painful area. To do this: Put ice in a plastic bag. Place a towel between your skin and the bag. Leave the ice on for 20 minutes, 2-3 times a day. If directed, apply heat to the affected area as often as told by your health care provider. Use the heat source that your health care provider recommends, such as a moist heat pack or a heating pad. Place a towel between your skin and the heat source. Leave the heat on for 20-30 minutes. Remove the heat if your skin turns bright red. This is especially important if you are unable to feel pain, heat, or cold. You may have a greater risk of getting burned. Activity Rest as told by your health care provider. Avoid activities that make pain worse. This includes any  activities that use chest, abdominal, and side muscles. Do not lift anything that is heavier than 10 lb (4.5 kg), or the limit that you are told, until your health care provider says that it is safe. Return to your normal activities as told by your health care provider. Ask your health care provider what activities are safe for you. General instructions Take over-the-counter and prescription medicines only as told by your  health care provider. Keep all follow-up visits as told by your health care provider. This is important. Contact a health care provider if: You have chills or a fever. Your pain does not go away or it gets worse. You have a cough that does not go away. Get help right away if: You have shortness of breath. You have severe chest pain that is not relieved by medicines, heat, or ice. These symptoms may represent a serious problem that is an emergency. Do not wait to see if the symptoms will go away. Get medical help right away. Call your local emergency services (911 in the [place] Do not drive yourself to the hospital.  Summary Costochondritis is inflammation of the tissue (cartilage) that connects the ribs to the breastbone (sternum). This condition causes pain in the front of the chest. Costochondritis results from stress on the cartilage where your ribs attach to your sternum. Treatment may include medicines, rest, heat or ice, and exercises. This information is not intended to replace advice given to you by your health care provider. Make sure you discuss any questions you have with your health care provider. Document Revised: 05/16/2019 Document Reviewed: 05/16/2019 Elsevier Patient Education  [place]

## 2022-01-27 ENCOUNTER — Encounter: Payer: Self-pay | Admitting: *Deleted

## 2022-04-09 NOTE — Progress Notes (Signed)
Patient with history of C5-6 and C6-7 HNP/gnosis comes in for prep evaluation.  Symptoms unchanged from previous visit.  Wants to proceed with C5-6 and C6-7 ACDF is scheduled.  Surgical procedure discussed

## 2022-09-22 ENCOUNTER — Encounter (HOSPITAL_COMMUNITY): Payer: Self-pay

## 2022-09-22 ENCOUNTER — Emergency Department (HOSPITAL_COMMUNITY)
Admission: EM | Admit: 2022-09-22 | Discharge: 2022-09-22 | Disposition: A | Discharge status: Home / Self Care | Payer: 59 | Attending: Emergency Medicine | Admitting: Emergency Medicine

## 2022-09-22 ENCOUNTER — Ambulatory Visit (HOSPITAL_COMMUNITY)
Admission: EM | Admit: 2022-09-22 | Discharge: 2022-09-22 | Disposition: A | Discharge status: Home / Self Care | Payer: 59

## 2022-09-22 ENCOUNTER — Emergency Department (HOSPITAL_COMMUNITY): Payer: 59

## 2022-09-22 ENCOUNTER — Other Ambulatory Visit: Payer: Self-pay

## 2022-09-22 DIAGNOSIS — M79602 Pain in left arm: Secondary | ICD-10-CM | POA: Diagnosis not present

## 2022-09-22 DIAGNOSIS — R002 Palpitations: Secondary | ICD-10-CM | POA: Insufficient documentation

## 2022-09-22 DIAGNOSIS — M542 Cervicalgia: Secondary | ICD-10-CM | POA: Diagnosis not present

## 2022-09-22 DIAGNOSIS — R202 Paresthesia of skin: Secondary | ICD-10-CM | POA: Insufficient documentation

## 2022-09-22 DIAGNOSIS — I10 Essential (primary) hypertension: Secondary | ICD-10-CM | POA: Diagnosis not present

## 2022-09-22 DIAGNOSIS — R079 Chest pain, unspecified: Secondary | ICD-10-CM | POA: Insufficient documentation

## 2022-09-22 DIAGNOSIS — R000 Tachycardia, unspecified: Secondary | ICD-10-CM | POA: Diagnosis not present

## 2022-09-22 LAB — CBC WITH DIFFERENTIAL/PLATELET
Abs Immature Granulocytes: 0.01 10*3/uL (ref 0.00–0.07)
Basophils Absolute: 0 10*3/uL (ref 0.0–0.1)
Basophils Relative: 1 %
Eosinophils Absolute: 0.2 10*3/uL (ref 0.0–0.5)
Eosinophils Relative: 3 %
HCT: 41.8 % (ref 36.0–46.0)
Hemoglobin: 13.7 g/dL (ref 12.0–15.0)
Immature Granulocytes: 0 %
Lymphocytes Relative: 18 %
Lymphs Abs: 1.2 10*3/uL (ref 0.7–4.0)
MCH: 29.2 pg (ref 26.0–34.0)
MCHC: 32.8 g/dL (ref 30.0–36.0)
MCV: 89.1 fL (ref 80.0–100.0)
Monocytes Absolute: 0.5 10*3/uL (ref 0.1–1.0)
Monocytes Relative: 8 %
Neutro Abs: 4.7 10*3/uL (ref 1.7–7.7)
Neutrophils Relative %: 70 %
Platelets: 249 10*3/uL (ref 150–400)
RBC: 4.69 MIL/uL (ref 3.87–5.11)
RDW: 12.7 % (ref 11.5–15.5)
WBC: 6.7 10*3/uL (ref 4.0–10.5)
nRBC: 0 % (ref 0.0–0.2)

## 2022-09-22 LAB — BASIC METABOLIC PANEL
Anion gap: 10 (ref 5–15)
BUN: 8 mg/dL (ref 6–20)
CO2: 25 mmol/L (ref 22–32)
Calcium: 9 mg/dL (ref 8.9–10.3)
Chloride: 102 mmol/L (ref 98–111)
Creatinine, Ser: 0.68 mg/dL (ref 0.44–1.00)
GFR, Estimated: >60 mL/min (ref >60)
Glucose, Bld: 117 mg/dL — ABNORMAL HIGH (ref 70–99)
Potassium: 4 mmol/L (ref 3.5–5.1)
Sodium: 137 mmol/L (ref 135–145)

## 2022-09-22 LAB — TROPONIN I (HIGH SENSITIVITY)
Troponin I (High Sensitivity): 4 ng/L (ref <18)
Troponin I (High Sensitivity): 3 ng/L (ref <18)

## 2022-09-22 LAB — D-DIMER, QUANTITATIVE: D-Dimer, Quant: <0.27 ug/mL-FEU (ref 0.00–0.50)

## 2022-09-22 MED ORDER — PREDNISONE 20 MG PO TABS: 40.0000 mg | ORAL_TABLET | Freq: Once | ORAL | Status: DC

## 2022-09-22 MED ORDER — IBUPROFEN 600 MG PO TABS: 600.0000 mg | ORAL_TABLET | Freq: Four times a day (QID) | ORAL | 0 refills | Status: DC | PRN

## 2022-09-22 MED ORDER — KETOROLAC TROMETHAMINE 15 MG/ML IJ SOLN
15.0000 mg | Freq: Once | INTRAMUSCULAR | Status: AC
  Administered 2022-09-22: 15 mg via INTRAVENOUS
  Filled 2022-09-22: qty 1

## 2022-09-22 NOTE — ED Triage Notes (Signed)
Pt reports heart palpitations, left side arm pain and calf pain x 2 days. Pt states she had cervical fusion in 2021. Pt states she is very anxious.

## 2022-09-22 NOTE — Discharge Instructions (Signed)
As discussed, workup today overall reassuring.  Symptoms most likely secondary to cervical radiculopathy versus muscular strain/prednisone.  Will treat with nonsteroidals in the form of ibuprofen every 6 hours as needed for pain/inflammation as well as continued use of muscle laxer.  Recommend follow-up with primary care regarding A1c, high blood pressure as well as testing for sleep apnea as we discussed as well as follow-up with neurosurgeon regarding neck pain.  Please do not hesitate to return to emergency department for worrisome signs and symptoms we discussed become apparent.

## 2022-09-22 NOTE — ED Triage Notes (Addendum)
Pt came POV, advised from Urgent Care to come to ED. Pt c/o heart palpitations, left arm weakness, calf pain on left side, and abd/back pain. Pt had cervical fusion in 2021. Pt axox4/ ambulatory. VSS. Pt states she feels dizzy.

## 2022-09-22 NOTE — ED Notes (Signed)
Pt states she is very nervous. RN reassured her she is fine and she will be evaluated to address any concerns.   When pt was asked, "What brings her in?" Pt says, she rather the RN review information from UC so nothing is construed.

## 2022-09-22 NOTE — ED Provider Notes (Signed)
[facility] Provider Note   CSN: [id number] Arrival date & time: 09/22/22  0940     History  Chief Complaint  Patient presents with   Chest pain Left arm pain    Beverly Hughes is a 57 y.o. female.  Chest Pain Left arm pain   57 year old female presents emergency department with multiple complaints.  Patient complains of left arm pain as well as chest pain.  Patient states that symptoms began 2 days ago when she experienced symptoms of left arm/left neck pain awakening her from sleep.  States that she initially felt like she slept wrong on affected arm causing symptoms.  States that symptoms responded well to muscle relaxers as well as NSAIDs but have been intermittent since onset.  She reports feelings of palpitations, bilateral lower chest pain beneath her breasts as well.  She reports history of cervical neck neck fusion in 2021 and states that since then has had some intermittent left hand numbness with more persistence of left index finger numbness.  She reports mild increase in left hand numbness since symptom onset 2 days ago which have responded somewhat to nonsteroidals outpatient.  Describes bilateral lower chest pain as sharp associated with feelings of heart racing.  Patient had recent travel when she flew for approximately 2 and half to 3 hours 2 weeks ago.  Denies history of DVT/PE, recent surgery/immobilization, hormone use, known malignancy.  Denies fever, chills, cough, congestion, abdominal pain, nausea, vomiting, urinary symptoms, change in bowel habits.  Denies visual disturbance, gait abnormalities, weakness in upper or lower extremities, slurred speech, facial droop.  Was sent by urgent care for further assessment.  Past medical history significant for cervical cord myelomalacia, cervical spinal stenosis, hypertension, hyperlipidemia, prediabetes  Home Medications Prior to Admission medications   Medication Sig Start Date  End Date Taking? Authorizing Provider  ibuprofen (ADVIL) 600 MG tablet Take 1 tablet (600 mg total) by mouth every 6 (six) hours as needed. 09/22/22  Yes Dion Saucier A, PA  Ascorbic Acid (VITAMIN C PO) Take by mouth.    [provider]  betamethasone dipropionate (DIPROLENE) 0.05 % ointment Apply topically 2 (two) times daily. 09/26/21   Marney Setting, NP  Calcium-Magnesium-Zinc (CALCIUM-MAGNESUIUM-ZINC PO) Take 1 tablet by mouth in the morning, at noon, and at bedtime.    [provider]  cholecalciferol (VITAMIN D3) 25 MCG (1000 UNIT) tablet Take 1,000 Units by mouth daily.    [provider]  cyclobenzaprine (FLEXERIL) 10 MG tablet Take 1 tablet (10 mg total) by mouth 3 (three) times daily as needed for muscle spasms. 10/01/21   Montine Circle, PA-C  fluticasone (FLONASE) 50 MCG/ACT nasal spray Place 2 sprays into both nostrils daily. 01/03/22 01/03/23  Mayers, Cari S, PA-C  hydrOXYzine (ATARAX) 25 MG tablet Take 1 tablet (25 mg total) by mouth every 6 (six) hours. Patient not taking: Reported on 01/03/2022 09/26/21   Marney Setting, NP  methocarbamol (ROBAXIN) 500 MG tablet Take 1 tablet (500 mg total) by mouth 2 (two) times daily. 10/08/21   Henderly, Britni A, PA-C  Multiple Vitamins-Minerals (ZINC PO) Take by mouth.    [provider]  omeprazole (PRILOSEC) 20 MG capsule omeprazole 20 mg capsule,delayed release Patient not taking: Reported on 01/03/2022    [provider]  SUMAtriptan (IMITREX) 25 MG tablet Take 25 mg (1 tablet) by mouth at the start of the headache. May repeat in 2 hours x 1 if headache persists.  Max of 2 tablets/24 hours. Patient not taking: Reported on 01/03/2022 10/14/21   Camillia Herter, NP  traMADol (ULTRAM) 50 MG tablet Take 50 mg by mouth every 6 (six) hours as needed.    [provider]      Allergies    Codeine, Other, and Codeine sulfate    Review of Systems   Review of Systems  Neurological:   Positive for weakness.  All other systems reviewed and are negative.   Physical Exam Updated Vital Signs BP (!) 150/130   Pulse 65   Temp 97.9 F (36.6 C) (Oral)   Resp 18   Ht '5\' 4"'$  (1.626 m)   Wt 90.7 kg   SpO2 100%   BMI 34.33 kg/m  Physical Exam Vitals and nursing note reviewed.  Constitutional:      General: She is not in acute distress.    Appearance: She is well-developed.  HENT:     Head: Normocephalic and atraumatic.     Right Ear: Tympanic membrane normal.     Left Ear: Tympanic membrane normal.  Eyes:     Conjunctiva/sclera: Conjunctivae normal.  Cardiovascular:     Rate and Rhythm: Regular rhythm. Tachycardia present.     Heart sounds: No murmur heard.    Comments: Mild tachycardia with a heart rate of 103 upon initial presentation/interview Pulmonary:     Effort: Pulmonary effort is normal. No respiratory distress.     Breath sounds: Normal breath sounds. No wheezing, rhonchi or rales.  Chest:     Chest wall: No tenderness.  Abdominal:     Palpations: Abdomen is soft.     Tenderness: There is no abdominal tenderness. There is no right CVA tenderness, left CVA tenderness, guarding or rebound.  Musculoskeletal:        General: No swelling.     Cervical back: Neck supple.     Right lower leg: No edema.     Comments: No midline cervical, thoracic, lumbar tenderness with no obvious step-off or deformity.  No paraspinal tenderness noted throughout spine.  Patient does have tender to palpation of left lateral trapezius with radiation into left deltoid region.  Pain exacerbated with range of motion of left shoulder but patient has full range of motion of send shoulder.  Full range of motion of bilateral shoulders, elbows, wrists, digits.  Radial pulses 2+ bilaterally.  Patient with reported sensory deficits on anterior left hand with most sensation changes along anterior index finger.  Muscular strength 5 out of 5 for upper extremities.  No sensory deficits along major  nerve restrictions of right upper extremity.  No overlying skin abnormalities appreciated.  Patient with full range of motion of bilateral lower extremities.  Muscular strength 5.  Pedal pulses 2+ bilaterally.  No sensory deficits along major distributions of lower extremities.  Skin:    General: Skin is warm and dry.     Capillary Refill: Capillary refill takes less than 2 seconds.  Neurological:     Mental Status: She is alert.     Comments: Alert and oriented to self, place, time and event.   Speech is fluent, clear without dysarthria or dysphasia.   Normal gait.  Normal finger-to-nose and feet tapping.  CN I not tested  CN II not tested CN III, IV, VI PERRLA and EOMs intact bilaterally  CN V Intact sensation to sharp and light touch to the face  CN VII facial movements symmetric  CN VIII not tested  CN IX, X no  uvula deviation, symmetric rise of soft palate  CN XI 5/5 SCM and trapezius strength bilaterally  CN XII Midline tongue protrusion, symmetric L/R movements     Psychiatric:        Mood and Affect: Mood normal.     ED Results / Procedures / Treatments   Labs (all labs ordered are listed, but only abnormal results are displayed) Labs Reviewed  BASIC METABOLIC PANEL - Abnormal; Notable for the following components:      Result Value   Glucose, Bld 117 (*)    All other components within normal limits  CBC WITH DIFFERENTIAL/PLATELET  D-DIMER, QUANTITATIVE  TROPONIN I (HIGH SENSITIVITY)  TROPONIN I (HIGH SENSITIVITY)    EKG None  Radiology DG Chest Port 1 View  Result Date: 09/22/2022 CLINICAL DATA:  57 year old female with chest pain, palpitations, left side weakness. EXAM: PORTABLE CHEST 1 VIEW COMPARISON:  Portable chest 12/02/2021 and earlier. FINDINGS: Portable AP semi upright view at 1024 hours. Stable cardiac size, mildly enlarged and confirmed on lateral chest radiographs last year. Other mediastinal contours are within normal limits. Normal lung volumes.  Visualized tracheal air column is within normal limits. Allowing for portable technique the lungs are clear. Partially visible chronic cervical ACDF. No acute osseous abnormality identified. Paucity of bowel gas the visible abdomen. IMPRESSION: Stable mild cardiomegaly.  No acute cardiopulmonary abnormality. Electronically Signed   By: Genevie Ann M.D.   On: 09/22/2022 10:30    Procedures Procedures    Medications Ordered in ED Medications  ketorolac (TORADOL) 15 MG/ML injection 15 mg (15 mg Intravenous Given 09/22/22 1136)    ED Course/ Medical Decision Making/ A&P Clinical Course as of 09/22/22 1340  Fri Sep 22, 2022  1115 DG Chest [place] 1 View [CR]    Clinical Course User Index [CR] Wilnette Kales, [place]                             Medical Decision Making Amount and/or Complexity of Data Reviewed Labs: ordered. Radiology: ordered. Decision-making details documented in ED Course.  Risk Prescription drug management.   This patient presents to the ED for concern of chest pain/left arm pain, this involves an extensive number of treatment options, and is a complaint that carries with it a high risk of complications and morbidity.  The differential diagnosis includes ACS, PE, pneumothorax, aortic dissection, aortic aneurysm, pericarditis/myocarditis/tamponade, musculoskeletal pain, fracture, strain chest pain, dislocation, ischemic limb, cervical radiculopathy, cellulitis, erysipelas, necrotizing fasciitis, CVA, cerebral venous thrombosis, vertebral/carotid artery dissection   Co morbidities that complicate the patient evaluation  See HPI   Additional history obtained:  Additional history obtained from EMR External records from outside source obtained and reviewed including hospital records   Lab Tests:  I Ordered, and personally interpreted labs.  The pertinent results include: No leukocytosis appreciated.  No evidence of anemia.  Platelets within range.  No electrolyte  abnormalities appreciated.  No renal dysfunction.  D-dimer negative so doubt PE.  Initial troponin of 4 with repeat3; EKG concerning for sinus rhythm without signs of acute ischemic changes.   Imaging Studies ordered:  I ordered imaging studies including chest x-ray I independently visualized and interpreted imaging which showed no acute cardiopulmonary process I agree with the radiologist interpretation   Cardiac Monitoring: / EKG:  The patient was maintained on a cardiac monitor.  I personally viewed and interpreted the cardiac monitored which showed an underlying rhythm of: Sinus rhythm without acute  ischemic changes.  Possible right atrial enlargement.   Consultations Obtained:  N/a   Problem List / ED Course / Critical interventions / Medication management  Chest pain/left arm pain I ordered medication including Toradol  Reevaluation of the patient after these medicines showed that the patient improved I have reviewed the patients home medicines and have made adjustments as needed   Social Determinants of Health:  Denies tobacco, illicit drug use   Test / Admission - Considered:  Chest pain/abdominal pain Vitals signs significant for mild hypertension with blood pressure 150/95.  Recommend follow-up with primary care regarding elevation blood pressure.. Otherwise within normal range and stable throughout visit. Laboratory/imaging studies significant for: See above Patient presents emergency department with complaints of bilateral chest pain arm/shoulder pain.  Patient's workup today overall reassuring.  Patient's complaint of chest pain very vague in nature described as sharp and occurring with palpitations only lasting a matter of seconds without exacerbation with physical activity and occurring at random times.  Very low suspicion for PE given negative D-dimer, lack of pleuritic chest pain, persistent tachycardia, fever; was obtained because patient was initially  tachycardic with recent travel.  Doubt ACS given delta negative troponin, lack of EKG changes.  Patient with heart score of 0-3.  Doubt pneumothorax, pneumonia, peritonitis/myocarditis/tamponade, aortic dissection.  Doubt CVA, cerebral venous thrombosis, complex migraine, less vertebral/carotid artery dissection given patient without acute neurologic deficit, improvement of symptoms with nonsteroidals as well as physical exam findings as depicted above.  Regarding his patient left upper extremity/neck pain, symptoms consistent with muscular etiology versus cervical radiculopathy.  Patient with mild sensory deficits on anterior left hand seem to be acute on chronic given history of cervical fusion as well as symptoms similar in the past prior to episode 2 days ago.  Doubt ischemic limb, VTE acute fracture/dislocation.  Will treat at home with nonsteroidals in the form of Motrin as well as muscle laxer to use as needed.  Discussion was had regarding use of prednisone but patient with recent A1c of 6.9 as well as experienced adverse effects to prednisone in the past so we will hold at this time.  Recommend follow-up with primary care as well as neurosurgeon for reassessment of symptoms.  Treatment plan discussed at length with patient and she acknowledged understanding was agreeable to said plan.   Worrisome signs and symptoms were discussed with the patient, and the patient acknowledged understanding to return to the ED if noticed. Patient was stable upon discharge.          Final Clinical Impression(s) / ED Diagnoses Final diagnoses:  Chest pain, unspecified type  Left arm pain    Rx / DC Orders ED Discharge Orders          Ordered    ibuprofen (ADVIL) 600 MG tablet  Every 6 hours PRN        09/22/22 [place], [place] A, [place] 09/22/22 1340    Audley Hose, MD 09/23/22 1604

## 2022-09-22 NOTE — ED Provider Notes (Signed)
[facility]    CSN: [id number] Arrival date & time: 09/22/22  [id number]      History   Chief Complaint Chief Complaint  Patient presents with   Arm Pain   Chest Pain   Hand Pain    HPI Beverly Hughes is a 57 y.o. female.   Patient presents today with a 2-day history of intermittent chest pain, palpitations, left arm pain and numbness as well as left calf pain.  She does report some lightheadedness but denies any syncopal episodes.  She has a history of anxiety and believes that this is contributing to her symptoms but is also concerned that there might be something more serious going on.  She denies any shortness of breath.  She reports that pain was 10 out of 10 yesterday and she took some ibuprofen which has provided some relief of symptoms.  She does have several risk factors including prediabetes, hyperlipidemia, hypertension.  She is unsure if her symptoms could be related to degenerative disc disease as she has noted some spasm in her trapezius but denies any known injury or increase in activity prior to symptom onset.  She denies any exogenous hormone use, history of malignancy, recent COVID-19 infection.  She does report recent travel; flu approximately 2 weeks ago to visit her grandchild.    Past Medical History:  Diagnosis Date   Allergy    Arthritis    [employer] 08/20/2020   Hypertension    [employer] 08/20/2020   Mixed hyperlipidemia    Nut allergy    [place] nut    Patient Active Problem List   Diagnosis Date Noted   Prediabetes 02/17/2021   Vitamin D deficiency 02/17/2021   Abnormal serum thyroid stimulating hormone (TSH) level 02/17/2021   Encounter for screening colonoscopy    Arthritis 09/17/2020   Hyperlipidemia 09/17/2020   Hypertensive disorder 08/20/2020   S/P cervical spinal fusion 10/15/2019   Degeneration of intervertebral disc at C4-C5 level 05/01/2019   Cervical spinal stenosis 03/05/2019   Cervical cord myelomalacia ([place]) 01/14/2019     Past Surgical History:  Procedure Laterality Date   ANTERIOR CERVICAL CORPECTOMY N/A 10/15/2019   Procedure: ANTERIOR CERVICAL CORPECTOMY C5;  Surgeon: Meade Maw, MD;  Location: [facility] ORS;  Service: Neurosurgery;  Laterality: N/A;   ANTERIOR CERVICAL DECOMP/DISCECTOMY FUSION N/A 10/15/2019   Procedure: C6-7 ANTERIOR CERVICAL DISCECTOMY AND FUSION, C4-7 ANTERIOR SPINAL INSTRUMENTATION;  Surgeon: Meade Maw, MD;  Location: [facility] ORS;  Service: Neurosurgery;  Laterality: N/A;   COLONOSCOPY WITH PROPOFOL N/A 10/04/2020   Procedure: COLONOSCOPY WITH PROPOFOL;  Surgeon: Lin Landsman, MD;  Location: [facility] ENDOSCOPY;  Service: Gastroenterology;  Laterality: N/A;   EYE SURGERY     tear duct repair   FRACTURE SURGERY     left foot fracture at 57 yrs old   TUBAL LIGATION      OB History   No obstetric history on file.      Home Medications    Prior to Admission medications   Medication Sig Start Date End Date Taking? Authorizing Provider  Ascorbic Acid (VITAMIN C PO) Take by mouth.    [provider]  betamethasone dipropionate (DIPROLENE) 0.05 % ointment Apply topically 2 (two) times daily. 09/26/21   Marney Setting, NP  Calcium-Magnesium-Zinc (CALCIUM-MAGNESUIUM-ZINC PO) Take 1 tablet by mouth in the morning, at noon, and at bedtime.    [provider]  cholecalciferol (VITAMIN D3) 25 MCG (1000 UNIT) tablet Take 1,000 Units by mouth daily.  [provider]  cyclobenzaprine (FLEXERIL) 10 MG tablet Take 1 tablet (10 mg total) by mouth 3 (three) times daily as needed for muscle spasms. 10/01/21   Montine Circle, PA-C  fluticasone (FLONASE) 50 MCG/ACT nasal spray Place 2 sprays into both nostrils daily. 01/03/22 01/03/23  Mayers, Cari S, PA-C  hydrOXYzine (ATARAX) 25 MG tablet Take 1 tablet (25 mg total) by mouth every 6 (six) hours. Patient not taking: Reported on 01/03/2022 09/26/21   Marney Setting, NP  methocarbamol (ROBAXIN) 500 MG  tablet Take 1 tablet (500 mg total) by mouth 2 (two) times daily. 10/08/21   Henderly, Britni A, PA-C  Multiple Vitamins-Minerals (ZINC PO) Take by mouth.    [provider]  omeprazole (PRILOSEC) 20 MG capsule omeprazole 20 mg capsule,delayed release Patient not taking: Reported on 01/03/2022    [provider]  SUMAtriptan (IMITREX) 25 MG tablet Take 25 mg (1 tablet) by mouth at the start of the headache. May repeat in 2 hours x 1 if headache persists. Max of 2 tablets/24 hours. Patient not taking: Reported on 01/03/2022 10/14/21   Camillia Herter, NP  traMADol (ULTRAM) 50 MG tablet Take 50 mg by mouth every 6 (six) hours as needed.    [provider]    Family History Family History  Problem Relation Age of Onset   Diabetes Mother    Kidney Stones Mother    Heart disease Mother    Cancer Father    Prostate cancer Father    Diabetes Maternal Grandmother    Heart disease Maternal Grandmother    Hypertension Maternal Grandmother    Diabetes Maternal Grandfather    Heart disease Maternal Grandfather    Hypertension Maternal Grandfather    Colon cancer Paternal Grandmother    Hypertension Paternal Grandmother     Social History Social History   Tobacco Use   Smoking status: Never   Smokeless tobacco: Never  Vaping Use   Vaping Use: Never used  Substance Use Topics   Alcohol use: Yes    Alcohol/week: 2.0 standard drinks of alcohol    Types: 1 Cans of beer, 1 Glasses of wine per week    Comment: socially   Drug use: No     Allergies   Codeine, Other, and Codeine sulfate   Review of Systems Review of Systems  Constitutional:  Positive for activity change and fatigue. Negative for appetite change and fever.  Respiratory:  Negative for cough and shortness of breath.   Cardiovascular:  Positive for chest pain and palpitations. Negative for leg swelling.  Gastrointestinal:  Negative for abdominal pain, diarrhea, nausea and vomiting.   Musculoskeletal:  Positive for arthralgias, myalgias and neck pain.  Neurological:  Positive for light-headedness. Negative for dizziness and headaches.     Physical Exam Triage Vital Signs ED Triage Vitals [09/22/22 0907]  Enc Vitals Group     BP (!) 158/90     Pulse Rate (!) 113     Resp 20     Temp      Temp src      SpO2 100 %     Weight      Height      Head Circumference      Peak Flow      Pain Score      Pain Loc      Pain Edu?      Excl. in Micro?    No data found.  Updated Vital Signs BP (!) 158/90 (BP  Location: Left Arm)   Pulse (!) 113   Resp 20   SpO2 100%   Visual Acuity Right Eye Distance:   Left Eye Distance:   Bilateral Distance:    Right Eye Near:   Left Eye Near:    Bilateral Near:     Physical Exam Vitals reviewed.  Constitutional:      General: She is awake. She is not in acute distress.    Appearance: Normal appearance. She is well-developed. She is not ill-appearing.     Comments: Very pleasant female appears stated age anxious but in no acute distress  HENT:     Head: Normocephalic and atraumatic.  Cardiovascular:     Rate and Rhythm: Regular rhythm. Tachycardia present.     Heart sounds: Normal heart sounds, S1 normal and S2 normal. No murmur heard.    Comments: Negative [place] on left Pulmonary:     Effort: Pulmonary effort is normal.     Breath sounds: Normal breath sounds. No wheezing, rhonchi or rales.     Comments: Clear to auscultation Abdominal:     General: Bowel sounds are normal.     Palpations: Abdomen is soft.     Tenderness: There is no abdominal tenderness. There is no right CVA tenderness, left CVA tenderness, guarding or rebound.  Musculoskeletal:     Cervical back: Spasms and tenderness present. No bony tenderness.     Thoracic back: No tenderness or bony tenderness.     Lumbar back: No tenderness or bony tenderness.     Right lower leg: No edema.     Left lower leg: No edema.     Comments: Tenderness palpation  and spasm noted along left trapezius  Psychiatric:        Behavior: Behavior is cooperative.      UC Treatments / Results  Labs (all labs ordered are listed, but only abnormal results are displayed) Labs Reviewed - No data to display  EKG   Radiology No results found.  Procedures Procedures (including critical care time)  Medications Ordered in UC Medications - No data to display  Initial Impression / Assessment and Plan / UC Course  I have reviewed the triage vital signs and the nursing notes.  Pertinent labs & imaging results that were available during my care of the patient were reviewed by me and considered in my medical decision making (see chart for details).     Patient is tachycardic on intake.  EKG was obtained that showed sinus tachycardia with ventricular rate of 107 bpm without ischemic changes; compared to 09/06/2020 sinus tachycardia has replaced normal sinus rhythm without additional changes.  Discussed that I am concerned given her recent travel for VTE event contributing to her symptoms I recommend she go to the emergency room for further evaluation and management.  She is agreeable to this and will go directly to [facility], ER.  She was stable at the time of discharge.  Final Clinical Impressions(s) / UC Diagnoses   Final diagnoses:  Chest pain, unspecified type  Left arm pain  Tachycardia   Discharge Instructions   None    ED Prescriptions   None    PDMP not reviewed this encounter.   Terrilee Croak, PA-C 09/22/22 [id number]

## 2022-10-01 NOTE — Progress Notes (Unsigned)
Referring Physician:  No referring provider defined for this encounter.  Primary Physician:  Camillia Herter, NP  History of Present Illness: 10/01/2022*** Beverly Hughes has a history of HTN, hyperlipidemia.   She is s/p C5 corpectomy with ACDF C4-C7 by Dr. Izora Ribas on 10/15/19 for cervical myelopathy. Last seen by him on 11/11/20 and was doing well. Had some swallowing difficulty that he felt was related to esophageal manipulation.   She was seen in ED on 09/22/22 for multiple complaints including neck and left arm pain.     She was discharged from ED on motrin.   Duration: *** Location: *** Quality: *** Severity: ***  Precipitating: aggravated by *** Modifying factors: made better by *** Weakness: none Timing: *** Bowel/Bladder Dysfunction: none  Conservative measures:  Physical therapy: ***  Multimodal medical therapy including regular antiinflammatories: ***  Injections: *** epidural steroid injections  Past Surgery:  C5 corpectomy with ACDF C4-C7 by Dr. Izora Ribas on 10/15/19 for cervical myelopathy  Beverly Hughes has ***no symptoms of cervical myelopathy.  The symptoms are causing a significant impact on the patient's life.   Review of Systems:  A 10 point review of systems is negative, except for the pertinent positives and negatives detailed in the HPI.  Past Medical History: Past Medical History:  Diagnosis Date   Allergy    Arthritis    Phreesia 08/20/2020   Hypertension    Phreesia 08/20/2020   Mixed hyperlipidemia    Nut allergy    [place] nut    Past Surgical History: Past Surgical History:  Procedure Laterality Date   ANTERIOR CERVICAL CORPECTOMY N/A 10/15/2019   Procedure: ANTERIOR CERVICAL CORPECTOMY C5;  Surgeon: Meade Maw, MD;  Location: [facility] ORS;  Service: Neurosurgery;  Laterality: N/A;   ANTERIOR CERVICAL DECOMP/DISCECTOMY FUSION N/A 10/15/2019   Procedure: C6-7 ANTERIOR CERVICAL DISCECTOMY AND FUSION, C4-7 ANTERIOR  SPINAL INSTRUMENTATION;  Surgeon: Meade Maw, MD;  Location: [facility] ORS;  Service: Neurosurgery;  Laterality: N/A;   COLONOSCOPY WITH PROPOFOL N/A 10/04/2020   Procedure: COLONOSCOPY WITH PROPOFOL;  Surgeon: Lin Landsman, MD;  Location: [facility] ENDOSCOPY;  Service: Gastroenterology;  Laterality: N/A;   EYE SURGERY     tear duct repair   FRACTURE SURGERY     left foot fracture at 57 yrs old   TUBAL LIGATION      Allergies: Allergies as of 10/03/2022 - Review Complete 09/22/2022  Allergen Reaction Noted   Codeine  08/20/2020   Other Anaphylaxis 12/31/2018   Codeine sulfate  07/26/2013    Medications: Outpatient Encounter Medications as of 10/03/2022  Medication Sig   Ascorbic Acid (VITAMIN C PO) Take by mouth.   betamethasone dipropionate (DIPROLENE) 0.05 % ointment Apply topically 2 (two) times daily.   Calcium-Magnesium-Zinc (CALCIUM-MAGNESUIUM-ZINC PO) Take 1 tablet by mouth in the morning, at noon, and at bedtime.   cholecalciferol (VITAMIN D3) 25 MCG (1000 UNIT) tablet Take 1,000 Units by mouth daily.   cyclobenzaprine (FLEXERIL) 10 MG tablet Take 1 tablet (10 mg total) by mouth 3 (three) times daily as needed for muscle spasms.   fluticasone (FLONASE) 50 MCG/ACT nasal spray Place 2 sprays into both nostrils daily.   hydrOXYzine (ATARAX) 25 MG tablet Take 1 tablet (25 mg total) by mouth every 6 (six) hours. (Patient not taking: Reported on 01/03/2022)   ibuprofen (ADVIL) 600 MG tablet Take 1 tablet (600 mg total) by mouth every 6 (six) hours as needed.   methocarbamol (ROBAXIN) 500 MG tablet Take 1 tablet (500 mg  total) by mouth 2 (two) times daily.   Multiple Vitamins-Minerals (ZINC PO) Take by mouth.   omeprazole (PRILOSEC) 20 MG capsule omeprazole 20 mg capsule,delayed release (Patient not taking: Reported on 01/03/2022)   SUMAtriptan (IMITREX) 25 MG tablet Take 25 mg (1 tablet) by mouth at the start of the headache. May repeat in 2 hours x 1 if headache persists. Max of 2  tablets/24 hours. (Patient not taking: Reported on 01/03/2022)   traMADol (ULTRAM) 50 MG tablet Take 50 mg by mouth every 6 (six) hours as needed.   No facility-administered encounter medications on file as of 10/03/2022.    Social History: Social History   Tobacco Use   Smoking status: Never   Smokeless tobacco: Never  Vaping Use   Vaping Use: Never used  Substance Use Topics   Alcohol use: Yes    Alcohol/week: 2.0 standard drinks of alcohol    Types: 1 Cans of beer, 1 Glasses of wine per week    Comment: socially   Drug use: No    Family Medical History: Family History  Problem Relation Age of Onset   Diabetes Mother    Kidney Stones Mother    Heart disease Mother    Cancer Father    Prostate cancer Father    Diabetes Maternal Grandmother    Heart disease Maternal Grandmother    Hypertension Maternal Grandmother    Diabetes Maternal Grandfather    Heart disease Maternal Grandfather    Hypertension Maternal Grandfather    Colon cancer Paternal Grandmother    Hypertension Paternal Grandmother     Physical Examination: There were no vitals filed for this visit.  General: Patient is well developed, well nourished, calm, collected, and in no apparent distress. Attention to examination is appropriate.  Respiratory: Patient is breathing without any difficulty.   NEUROLOGICAL:     Awake, alert, oriented to person, place, and time.  Speech is clear and fluent. Fund of knowledge is appropriate.   Cranial Nerves: Pupils equal round and reactive to light.  Facial tone is symmetric.    *** ROM of cervical spine *** pain *** posterior cervical tenderness. *** tenderness in bilateral trapezial region.   *** ROM of lumbar spine *** pain *** posterior lumbar tenderness.   No abnormal lesions on exposed skin.   Strength: Side Biceps Triceps Deltoid Interossei Grip Wrist Ext. Wrist Flex.  R 5 5 5 5 5 5 5   L 5 5 5 5 5 5 5    Side Iliopsoas Quads Hamstring PF DF EHL  R 5  5 5 5 5 5   L 5 5 5 5 5 5    Reflexes are ***2+ and symmetric at the biceps, triceps, brachioradialis, patella and achilles.   Hoffman's is absent.  Clonus is not present.   Bilateral upper and lower extremity sensation is intact to light touch.     Gait is normal.   ***No difficulty with tandem gait.    Medical Decision Making  Imaging: Cervical xrays dated 10/01/21:  FINDINGS: Straightening of the cervical spine.   Status post C5 corpectomy with C4-7 ACDF, unchanged. No evidence of hardware fracture or loosening.   Mild degenerative changes, most prominent at C3-4 and C7-T1.   No evidence of fracture or dislocation. Dens appears intact. Lateral masses of C1 are symmetric.   No prevertebral soft tissue swelling.   Moderate right C4-5 neural foraminal narrowing. Mild left C3-4 neural foraminal narrowing, although constrained by obliquity.   IMPRESSION: Status post C4-7 ACDF, without  evidence of complication.   Mild degenerative changes with moderate right C4-5 neural foraminal narrowing.   No fracture is seen.     Electronically Signed   By: Julian Hy M.D.   On: 10/01/2021 01:19   CT of cervical spine dated 10/08/21:   Alignment: No static subluxation. Facets are aligned. Occipital condyles and the lateral masses of C1 and C2 are normally approximated.   Skull base and vertebrae: There is no acute fracture. There is anterior fusion at the C4-7 levels, status post C5 corpectomy.   Soft tissues and spinal canal: No prevertebral fluid or swelling. No visible canal hematoma.   Disc levels: No advanced spinal canal or neural foraminal stenosis.   Upper chest: No pneumothorax, pulmonary nodule or pleural effusion.   Other: Normal visualized paraspinal cervical soft tissues.     Electronically Signed   By: Ulyses Jarred M.D.   On: 10/08/2021 01:12  I have personally reviewed the images and agree with the above interpretation.  Assessment and Plan: Ms.  Durrer is a pleasant 57 y.o. female has ***  Treatment options discussed with patient and following plan made:   - Order for physical therapy for *** spine ***. Patient to call to schedule appointment. *** - Continue current medications including ***. Reviewed dosing and side effects.  - Prescription for ***. Reviewed dosing and side effects. Take with food.  - Prescription for *** to take prn muscle spasms. Reviewed dosing and side effects. Discussed this can cause drowsiness.  - MRI of *** to further evaluate *** radiculopathy. No improvement time or medications (***).  - Referral to PMR at [facility] to discuss possible *** injections.  - Will schedule phone visit to review MRI results once I get them back.   I spent a total of *** minutes in face-to-face and non-face-to-face activities related to this patient's care today including review of outside records, review of imaging, review of symptoms, physical exam, discussion of differential diagnosis, discussion of treatment options, and documentation.   Thank you for involving me in the care of this patient.   Geronimo Boot PA-C Dept. of Neurosurgery

## 2022-10-03 ENCOUNTER — Ambulatory Visit
Admission: RE | Admit: 2022-10-03 | Discharge: 2022-10-03 | Disposition: A | Discharge status: Home / Self Care | Payer: 59 | Source: Ambulatory Visit | Attending: Orthopedic Surgery | Admitting: Orthopedic Surgery

## 2022-10-03 ENCOUNTER — Encounter: Payer: Self-pay | Admitting: Orthopedic Surgery

## 2022-10-03 ENCOUNTER — Ambulatory Visit
Admission: RE | Admit: 2022-10-03 | Discharge: 2022-10-03 | Disposition: A | Discharge status: Home / Self Care | Payer: 59 | Attending: Orthopedic Surgery | Admitting: Orthopedic Surgery

## 2022-10-03 ENCOUNTER — Ambulatory Visit (INDEPENDENT_AMBULATORY_CARE_PROVIDER_SITE_OTHER): Payer: 59 | Admitting: Orthopedic Surgery

## 2022-10-03 VITALS — BP 110/82 | Ht 64.0 in | Wt 200.0 lb

## 2022-10-03 DIAGNOSIS — M542 Cervicalgia: Secondary | ICD-10-CM | POA: Insufficient documentation

## 2022-10-03 DIAGNOSIS — Z981 Arthrodesis status: Secondary | ICD-10-CM

## 2022-10-03 DIAGNOSIS — M545 Low back pain, unspecified: Secondary | ICD-10-CM | POA: Insufficient documentation

## 2022-10-03 NOTE — Patient Instructions (Addendum)
It was so nice to see you today. Thank you so much for coming in.    I ordered xrays of your neck and back. You can get these at [facility] (building with the white pillars) off of [address]. The address is [address] [place], [place] [address]. You do not need any appointment. I will message you with results.   Please do not hesitate to call if you have any questions or concerns. You can also message me in [place].   I gave you a a work note. Let me know if you need anything else.   Geronimo Boot PA-C [phone]

## 2023-02-07 ENCOUNTER — Other Ambulatory Visit: Payer: Self-pay

## 2023-02-07 ENCOUNTER — Emergency Department (HOSPITAL_COMMUNITY)
Admission: EM | Admit: 2023-02-07 | Discharge: 2023-02-08 | Disposition: A | Discharge status: Home / Self Care | Payer: 59 | Attending: Emergency Medicine | Admitting: Emergency Medicine

## 2023-02-07 ENCOUNTER — Encounter (HOSPITAL_COMMUNITY): Payer: Self-pay | Admitting: *Deleted

## 2023-02-07 DIAGNOSIS — S39012A Strain of muscle, fascia and tendon of lower back, initial encounter: Secondary | ICD-10-CM | POA: Insufficient documentation

## 2023-02-07 DIAGNOSIS — R519 Headache, unspecified: Secondary | ICD-10-CM | POA: Diagnosis present

## 2023-02-07 DIAGNOSIS — Y92410 Unspecified street and highway as the place of occurrence of the external cause: Secondary | ICD-10-CM | POA: Insufficient documentation

## 2023-02-07 DIAGNOSIS — S060X0A Concussion without loss of consciousness, initial encounter: Secondary | ICD-10-CM | POA: Insufficient documentation
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
## 2023-02-08 ENCOUNTER — Emergency Department (HOSPITAL_COMMUNITY): Payer: 59

## 2023-02-08 MED ORDER — ACETAMINOPHEN 325 MG PO TABS
650.0000 mg | ORAL_TABLET | Freq: Once | ORAL | Status: AC
  Administered 2023-02-08: 650 mg via ORAL
  Filled 2023-02-08: qty 2
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
## 2023-03-04 ENCOUNTER — Emergency Department (HOSPITAL_COMMUNITY)
Admission: EM | Admit: 2023-03-04 | Discharge: 2023-03-04 | Disposition: A | Discharge status: Home / Self Care | Payer: 59 | Attending: Emergency Medicine | Admitting: Emergency Medicine

## 2023-03-04 ENCOUNTER — Emergency Department (HOSPITAL_COMMUNITY): Payer: 59

## 2023-03-04 ENCOUNTER — Other Ambulatory Visit: Payer: Self-pay

## 2023-03-04 ENCOUNTER — Encounter (HOSPITAL_COMMUNITY): Payer: Self-pay

## 2023-03-04 DIAGNOSIS — R000 Tachycardia, unspecified: Secondary | ICD-10-CM | POA: Diagnosis present

## 2023-03-04 DIAGNOSIS — I471 Supraventricular tachycardia, unspecified: Secondary | ICD-10-CM | POA: Insufficient documentation

## 2023-03-04 DIAGNOSIS — R002 Palpitations: Secondary | ICD-10-CM

## 2023-03-04 LAB — COMPREHENSIVE METABOLIC PANEL
ALT: 29 U/L (ref 0–44)
AST: 32 U/L (ref 15–41)
Albumin: 3.9 g/dL (ref 3.5–5.0)
Alkaline Phosphatase: 44 U/L (ref 38–126)
Anion gap: 8 (ref 5–15)
BUN: 12 mg/dL (ref 6–20)
CO2: 27 mmol/L (ref 22–32)
Calcium: 9 mg/dL (ref 8.9–10.3)
Chloride: 103 mmol/L (ref 98–111)
Creatinine, Ser: 0.79 mg/dL (ref 0.44–1.00)
GFR, Estimated: >60 mL/min (ref >60)
Glucose, Bld: 100 mg/dL — ABNORMAL HIGH (ref 70–99)
Potassium: 4.1 mmol/L (ref 3.5–5.1)
Sodium: 138 mmol/L (ref 135–145)
Total Bilirubin: 0.8 mg/dL (ref 0.3–1.2)
Total Protein: 7.5 g/dL (ref 6.5–8.1)

## 2023-03-04 LAB — CBC WITH DIFFERENTIAL/PLATELET
Abs Immature Granulocytes: 0.01 10*3/uL (ref 0.00–0.07)
Basophils Absolute: 0 10*3/uL (ref 0.0–0.1)
Basophils Relative: 1 %
Eosinophils Absolute: 0.3 10*3/uL (ref 0.0–0.5)
Eosinophils Relative: 5 %
HCT: 39.9 % (ref 36.0–46.0)
Hemoglobin: 13.2 g/dL (ref 12.0–15.0)
Immature Granulocytes: 0 %
Lymphocytes Relative: 29 %
Lymphs Abs: 1.6 10*3/uL (ref 0.7–4.0)
MCH: 29.5 pg (ref 26.0–34.0)
MCHC: 33.1 g/dL (ref 30.0–36.0)
MCV: 89.1 fL (ref 80.0–100.0)
Monocytes Absolute: 0.5 10*3/uL (ref 0.1–1.0)
Monocytes Relative: 10 %
Neutro Abs: 3 10*3/uL (ref 1.7–7.7)
Neutrophils Relative %: 55 %
Platelets: 289 10*3/uL (ref 150–400)
RBC: 4.48 MIL/uL (ref 3.87–5.11)
RDW: 12.8 % (ref 11.5–15.5)
WBC: 5.5 10*3/uL (ref 4.0–10.5)
nRBC: 0 % (ref 0.0–0.2)

## 2023-03-04 LAB — TROPONIN I (HIGH SENSITIVITY)
Troponin I (High Sensitivity): 4 ng/L (ref <18)
Troponin I (High Sensitivity): 18 ng/L — ABNORMAL HIGH (ref <18)

## 2023-03-04 LAB — MAGNESIUM: Magnesium: 2 mg/dL (ref 1.7–2.4)

## 2023-03-04 MED ORDER — LACTATED RINGERS IV BOLUS
1000.0000 mL | Freq: Once | INTRAVENOUS | Status: AC
  Administered 2023-03-04: 1000 mL via INTRAVENOUS

## 2023-03-04 NOTE — ED Provider Notes (Signed)
EMERGENCY DEPARTMENT AT [facility] Provider Note   CSN: [id number] Arrival date & time: 03/04/23  1724     History Chief Complaint  Patient presents with   Tachycardia    HPI Beverly Hughes is a 57 y.o. female presenting for chief complaint palpitations.  Patient brought in by EMS for tachycardia in the 170s. Was treated with adenosine and transfer for SVT with restoration of normal sinus rhythm. Patient states that she has been through an exhausting day.  She has a longstanding history of palpitations normally self manage.  Today she states that she had to go to the store after church did not have a chance to stop and get lunch.  States that she normally drinks water with her breakfast but forgot to bring her cup with her to church and that it was very hot today. Denies caffeine use.  Denies any chest pain.  Initially had some palpitations but syndrome resolved completely following adenosine..   Patient's recorded medical, surgical, social, medication list and allergies were reviewed in the Snapshot window as part of the initial history.   Review of Systems   Review of Systems  Constitutional:  Negative for chills and fever.  HENT:  Negative for ear pain and sore throat.   Eyes:  Negative for pain and visual disturbance.  Respiratory:  Negative for cough and shortness of breath.   Cardiovascular:  Positive for palpitations. Negative for chest pain.  Gastrointestinal:  Negative for abdominal pain and vomiting.  Genitourinary:  Negative for dysuria and hematuria.  Musculoskeletal:  Negative for arthralgias and back pain.  Skin:  Negative for color change and rash.  Neurological:  Negative for seizures and syncope.  All other systems reviewed and are negative.   Physical Exam Updated Vital Signs BP (!) 142/88 (BP Location: Right Arm)   Pulse (!) 112   Temp 97.6 F (36.4 C) (Oral)   Resp 20   Ht 5\' 4"  (1.626 m)   Wt 90.7 kg   LMP 12/20/2022   SpO2  100%   BMI 34.32 kg/m  Physical Exam Vitals and nursing note reviewed.  Constitutional:      General: She is not in acute distress.    Appearance: She is well-developed.  HENT:     Head: Normocephalic and atraumatic.  Eyes:     Conjunctiva/sclera: Conjunctivae normal.  Cardiovascular:     Rate and Rhythm: Normal rate and regular rhythm.     Heart sounds: No murmur heard. Pulmonary:     Effort: Pulmonary effort is normal. No respiratory distress.     Breath sounds: Normal breath sounds.  Abdominal:     General: There is no distension.     Palpations: Abdomen is soft.     Tenderness: There is no abdominal tenderness. There is no right CVA tenderness or left CVA tenderness.  Musculoskeletal:        General: No swelling or tenderness. Normal range of motion.     Cervical back: Neck supple.  Skin:    General: Skin is warm and dry.  Neurological:     General: No focal deficit present.     Mental Status: She is alert and oriented to person, place, and time. Mental status is at baseline.     Cranial Nerves: No cranial nerve deficit.      ED Course/ Medical Decision Making/ A&P    Procedures Procedures   Medications Ordered in ED Medications  lactated ringers bolus 1,000 mL (1,000 mLs  Intravenous New Bag/Given 03/04/23 1829)    Medical Decision Making:    EMALYNE BRECEDA is a 57 y.o. female who presented to the ED today with palpitations detailed above.     Complete initial physical exam performed, notably the patient  was Hemodynamically stable normal sinus rhythm on the monitor .      Reviewed and confirmed nursing documentation for past medical history, family history, social history.    Initial Assessment:   With the patient's presentation of palpitations, most likely diagnosis is SVT on review of the EMS records.. Other diagnoses were considered including (but not limited to) atrial fibrillation, [employer], a flutter, ACS, PE, pneumonia, pneumothorax.  These are considered less likely due to history of present illness and physical exam findings.   This is most consistent with an acute life/limb threatening illness complicated by underlying chronic conditions.  Initial Plan:  Screening labs including CBC and Metabolic panel to evaluate for infectious or metabolic etiology of disease.  Urinalysis with reflex culture ordered to evaluate for UTI or relevant urologic/nephrologic pathology.  CXR to evaluate for structural/infectious intrathoracic pathology.  Serial troponin/EKG to evaluate for cardiac pathology. Objective evaluation as below reviewed with plan for close reassessment  Initial Study Results:   Laboratory  All laboratory results reviewed without evidence of clinically relevant pathology.   Exceptions include: Troponin from 4-18  EKG EKG was reviewed independently. Rate, rhythm, axis, intervals all examined and without medically relevant abnormality. ST segments without concerns for elevations.    Radiology  All images reviewed independently. Agree with radiology report at this time.   DG Chest Portable 1 View  Result Date: 03/04/2023 CLINICAL DATA:  Shortness of breath EXAM: PORTABLE CHEST 1 VIEW COMPARISON:  Chest x-ray dated February 08, 2023 FINDINGS: Cardiac and mediastinal contours within normal limits. Visualized lungs are clear, evaluation of the left hemithorax somewhat limited due to overlying pacer pads. No evidence of pleural effusion or pneumothorax. IMPRESSION: No acute cardiopulmonary abnormality. Electronically Signed   By: Allegra Lai M.D.   On: 03/04/2023 19:43       Reassessment and Plan:   Patient observed in the emergency room for 4 hours.  She was asymptomatic throughout the entire duration of her stay.  Her heart rate down trended to the 80s. She has been ambulatory tolerated p.o. intake and has had no symptoms since arrival. Most likely A-fib of uncertain etiology.  She was treated with IV fluids for  suspected dehydration as the underlying etiology.  Her troponin went from 4-18 during her 4-hour stay.  However she does not have chest pain diaphoresis or EKG changes consistent with ACS.  I discussed this with the patient and she is in agreement.  She will follow-up with cardiology in the outpatient setting or return if she develops any chest pain or for the symptoms.    Disposition:  I have considered need for hospitalization, however, considering all of the above, I believe this patient is stable for discharge at this time.  Patient/family educated about specific return precautions for given chief complaint and symptoms.  Patient/family educated about follow-up with PCP/Cardiology.     Patient/family expressed understanding of return precautions and need for follow-up. Patient spoken to regarding all imaging and laboratory results and appropriate follow up for these results. All education provided in verbal form with additional information in written form. Time was allowed for answering of patient questions. Patient discharged.    Emergency Department Medication Summary:   Medications  lactated  ringers bolus 1,000 mL (1,000 mLs Intravenous New Bag/Given 03/04/23 1829)         Clinical Impression:  1. SVT (supraventricular tachycardia)   2. Palpitations      Discharge   Final Clinical Impression(s) / ED Diagnoses Final diagnoses:  SVT (supraventricular tachycardia)  Palpitations    Rx / DC Orders ED Discharge Orders          Ordered    Ambulatory referral to Cardiology        03/04/23 2130              Glyn Ade, MD 03/04/23 2131

## 2023-03-04 NOTE — ED Triage Notes (Signed)
Pt BIB [facility] from [employer] for SVT Pt was shopping at [employer] when she had sudden onset of chest pain, SHOB and palpitations Initially hypertensive 220/140. EMS attempted vagal maneuvers without success Pt received 6 mg of adenosine and 100 of fluid PTA.  BP 124/86 HR 108

## 2023-03-06 ENCOUNTER — Telehealth: Payer: Self-pay | Admitting: *Deleted

## 2023-03-06 ENCOUNTER — Ambulatory Visit: Payer: 59 | Attending: Cardiology | Admitting: Cardiology

## 2023-03-06 ENCOUNTER — Encounter: Payer: Self-pay | Admitting: Cardiology

## 2023-03-06 VITALS — BP 132/80 | HR 86 | Ht 64.0 in | Wt 198.4 lb

## 2023-03-06 DIAGNOSIS — R0683 Snoring: Secondary | ICD-10-CM

## 2023-03-06 DIAGNOSIS — I471 Supraventricular tachycardia, unspecified: Secondary | ICD-10-CM | POA: Diagnosis not present

## 2023-03-06 MED ORDER — METOPROLOL SUCCINATE ER 25 MG PO TB24: 25.0000 mg | ORAL_TABLET | Freq: Every day | ORAL | 3 refills | Status: DC

## 2023-03-06 NOTE — Progress Notes (Signed)
Referring-Beverly Countryman, MD Reason for referral-SVT  HPI: 57 year old female for evaluation of SVT at Request Glyn Ade, MD.  Seen August 18 with SVT.  Patient converted to sinus rhythm with 6 mg of adenosine.  HR 170. Chest x-ray without infiltrates.  Troponin 4 and 18, potassium 4.1, creatinine 0.79, normal liver functions, hemoglobin 13.2.  Cardiology asked to evaluate.  Patient has had occasional brief flutters intermittently for years but not sustained.  On the day of her event she started having tachycardia suddenly.  There was associated dyspnea, chest tightness and dizziness but no syncope.  Her symptoms lasted approximately 25 minutes until she was given adenosine.  She otherwise has dyspnea with more vigorous activities but not routine activities.  No orthopnea, PND, pedal edema or exertional chest pain.  Current Outpatient Medications  Medication Sig Dispense Refill   Calcium Carb-Cholecalciferol (CALCIUM-VITAMIN D3 PO) Take 1 tablet by mouth daily.     methocarbamol (ROBAXIN) 500 MG tablet Take 500 mg by mouth every 6 (six) hours as needed for muscle spasms.     ibuprofen (ADVIL) 600 MG tablet Take 600 mg by mouth every 6 (six) hours as needed. (Patient not taking: Reported on 03/06/2023)     No current facility-administered medications for this visit.    Allergies  Allergen Reactions   Other Anaphylaxis    [place] nut   Codeine Sulfate     Dizziness, risk of falling     Past Medical History:  Diagnosis Date   Allergy    Arthritis    Phreesia 08/20/2020   Hypertension    Phreesia 08/20/2020   Mixed hyperlipidemia    Nut allergy    [place] nut    Past Surgical History:  Procedure Laterality Date   ANTERIOR CERVICAL CORPECTOMY N/A 10/15/2019   Procedure: ANTERIOR CERVICAL CORPECTOMY C5;  Surgeon: Venetia Night, MD;  Location: [facility] ORS;  Service: Neurosurgery;  Laterality: N/A;   ANTERIOR CERVICAL DECOMP/DISCECTOMY FUSION N/A 10/15/2019   Procedure:  C6-7 ANTERIOR CERVICAL DISCECTOMY AND FUSION, C4-7 ANTERIOR SPINAL INSTRUMENTATION;  Surgeon: Venetia Night, MD;  Location: [facility] ORS;  Service: Neurosurgery;  Laterality: N/A;   COLONOSCOPY WITH PROPOFOL N/A 10/04/2020   Procedure: COLONOSCOPY WITH PROPOFOL;  Surgeon: Toney Reil, MD;  Location: [facility];  Service: Gastroenterology;  Laterality: N/A;   EYE SURGERY     tear duct repair   FRACTURE SURGERY     left foot fracture at 57 yrs old   TUBAL LIGATION      Social History   Socioeconomic History   Marital status: Legally Separated    Spouse name: Not on file   Number of children: 3   Years of education: Not on file   Highest education level: Not on file  Occupational History   Not on file  Tobacco Use   Smoking status: Never   Smokeless tobacco: Never  Vaping Use   Vaping status: Never Used  Substance and Sexual Activity   Alcohol use: Yes    Alcohol/week: 2.0 standard drinks of alcohol    Types: 1 Cans of beer, 1 Glasses of wine per week    Comment: socially   Drug use: No   Sexual activity: Not on file  Other Topics Concern   Not on file  Social History Narrative   Not on file   Social Determinants of Health   Financial Resource Strain: Not on file  Food Insecurity: Not on file  Transportation Needs: Not on file  Physical Activity:  Not on file  Stress: Not on file  Social Connections: Unknown (11/24/2021)   Received from [facility], [facility]   Social Network    Social Network: Not on file  Intimate Partner Violence: Unknown (10/21/2021)   Received from [facility], [facility]   HITS    Physically Hurt: Not on file    Insult or Talk Down To: Not on file    Threaten Physical Harm: Not on file    Scream or Curse: Not on file    Family History  Problem Relation Age of Onset   Diabetes Mother    Kidney Stones Mother    Heart disease Mother    Cancer Father    Prostate cancer Father    Diabetes Maternal Grandmother    Heart  disease Maternal Grandmother    Hypertension Maternal Grandmother    Diabetes Maternal Grandfather    Heart disease Maternal Grandfather    Hypertension Maternal Grandfather    Colon cancer Paternal Grandmother    Hypertension Paternal Grandmother     ROS: no fevers or chills, productive cough, hemoptysis, dysphasia, odynophagia, melena, hematochezia, dysuria, hematuria, rash, seizure activity, orthopnea, PND, pedal edema, claudication. Remaining systems are negative.  Physical Exam:   Blood pressure 132/80, pulse 86, height 5\' 4"  (1.626 m), weight 198 lb 6.4 oz (90 kg), last menstrual period 12/20/2022, SpO2 96%.  General:  Well developed/well nourished in NAD Skin warm/dry Patient not depressed No peripheral clubbing Back-normal HEENT-normal/normal eyelids Neck supple/normal carotid upstroke bilaterally; no bruits; no JVD; no thyromegaly chest - CTA/ normal expansion CV - RRR/normal S1 and S2; no murmurs, rubs or gallops;  PMI nondisplaced Abdomen -NT/ND, no HSM, no mass, + bowel sounds, no bruit 2+ femoral pulses, no bruits Ext-no edema, chords, 2+ DP Neuro-grossly nonfocal  ECG -electrocardiogram showed sinus tachycardia.  Personally reviewed  A/P  1 recent episode of SVT-patient has a documented episode of supraventricular tachycardia.  Will begin Toprol 25 mg daily.  Check echocardiogram and TSH.  If she has more frequent episodes despite beta-blockade will refer to electrophysiology for consideration of ablation.  2 hypertension-patient's blood pressure is reasonable at present.  I am adding Toprol for her SVT and we will follow.  3 hyperlipidemia-managed by primary care.  4 snoring-we will arrange sleep study to exclude sleep apnea.  Olga Millers, MD

## 2023-03-06 NOTE — Telephone Encounter (Signed)
Prior Authorization for [facility] sent to [facility] via web portal. Tracking Number . NO PA REQ :patient states she only has [employer].

## 2023-03-06 NOTE — Patient Instructions (Signed)
Medication Instructions:   START METOPROLOL SUCC ER 25 MG ONCE DAILY AT BEDTIME  *If you need a refill on your cardiac medications before your next appointment, please call your pharmacy*   Testing/Procedures:  Your physician has requested that you have an echocardiogram. Echocardiography is a painless test that uses sound waves to create images of your heart. It provides your doctor with information about the size and shape of your heart and how well your heart's chambers and valves are working. This procedure takes approximately one hour. There are no restrictions for this procedure. Please do NOT wear cologne, perfume, aftershave, or lotions (deodorant is allowed). Please arrive 15 minutes prior to your appointment time. [address]   WatchPAT?  Is a FDA cleared portable home sleep study test that uses a watch and 3 points of contact to monitor 7 different channels, including your heart rate, oxygen saturations, body position, snoring, and chest motion.  The study is easy to use from the comfort of your own home and accurately detect sleep apnea.  Before bed, you attach the chest sensor, attached the sleep apnea bracelet to your nondominant hand, and attach the finger probe.  After the study, the raw data is downloaded from the watch and scored for apnea events.   For more information: [email or web address]  Patient Testing Instructions:  Do not put battery into the device until bedtime when you are ready to begin the test. Please call the support number if you need assistance after following the instructions below: 24 hour support line- [phone] or ITAMAR support at [phone] (option 2)  Download the IntelWatchPAT One" app through the google play store or App Store  Be sure to turn on or enable access to bluetooth in settlings on your smartphone/ device  Make sure no other bluetooth devices are on and within the vicinity of your smartphone/ device  and WatchPAT watch during testing.  Make sure to leave your smart phone/ device plugged in and charging all night.  When ready for bed:  Follow the instructions step by step in the WatchPAT One App to activate the testing device. For additional instructions, including video instruction, visit the WatchPAT One video on Youtube. You can search for WatchPat One within Youtube (video is 4 minutes and 18 seconds) or enter: https://youtube/watch?v=BCce_vbiwxE Please note: You will be prompted to enter a Pin to connect via bluetooth when starting the test. The PIN will be assigned to you when you receive the test.  The device is disposable, but it recommended that you retain the device until you receive a call letting you know the study has been received and the results have been interpreted.  We will let you know if the study did not transmit to [place] properly after the test is completed. You do not need to call us to confirm the receipt of the test.  Please complete the test within 48 hours of receiving PIN.   Frequently Asked Questions:  What is Watch Dennie Bible one?  A single use fully disposable home sleep apnea testing device and will not need to be returned after completion.  What are the requirements to use WatchPAT one?  The be able to have a successful watchpat one sleep study, you should have your Watch pat one device, your smart phone, watch pat one app, your PIN number and Internet access What type of phone do I need?  You should have a smart phone that uses Android 5.1 and above or any Iphone with  IOS 10 and above How can I download the WatchPAT one app?  Based on your device type search for WatchPAT one app either in google play for android devices or APP store for Iphone's Where will I get my PIN for the study?  Your PIN will be provided by your physician's office. It is used for authentication and if you lose/forget your PIN, please reach out to your providers office.  I do not have Internet at  home. Can I do WatchPAT one study?  WatchPAT One needs Internet connection throughout the night to be able to transmit the sleep data. You can use your home/local internet or your cellular's data package. However, it is always recommended to use home/local Internet. It is estimated that between 20MB-30MB will be used with each study.However, the application will be looking for space in the phone to start the study.  What happens if I lose internet or bluetooth connection?  During the internet disconnection, your phone will not be able to transmit the sleep data. All the data, will be stored in your phone. As soon as the internet connection is back on, the phone will being sending the sleep data. During the bluetooth disconnection, WatchPAT one will not be able to to send the sleep data to your phone. Data will be kept in the [facility] one until two devices have bluetooth connection back on. As soon as the connection is back on, WatchPAT one will send the sleep data to the phone.  How long do I need to wear the WatchPAT one?  After you start the study, you should wear the device at least 6 hours.  How far should I keep my phone from the device?  During the night, your phone should be within 15 feet.  What happens if I leave the room for restroom or other reasons?  Leaving the room for any reason will not cause any problem. As soon as your get back to the room, both devices will reconnect and will continue to send the sleep data. Can I use my phone during the sleep study?  Yes, you can use your phone as usual during the study. But it is recommended to put your watchpat one on when you are ready to go to bed.  How will I get my study results?  A soon as you completed your study, your sleep data will be sent to the provider. They will then share the results with you when they are ready.     Follow-Up: At [facility], you and your health needs are our priority.  As part of our continuing  mission to provide you with exceptional heart care, we have created designated Provider Care Teams.  These Care Teams include your primary Cardiologist (physician) and Advanced Practice Providers (APPs -  Physician Assistants and Nurse Practitioners) who all work together to provide you with the care you need, when you need it.  We recommend signing up for the patient portal called "MyChart".  Sign up information is provided on this After Visit Summary.  MyChart is used to connect with patients for Virtual Visits (Telemedicine).  Patients are able to view lab/test results, encounter notes, upcoming appointments, etc.  Non-urgent messages can be sent to your provider as well.   To learn more about what you can do with MyChart, go to [email or web address].    Your next appointment:   3 month(s)  Provider:   Olga Millers, MD

## 2023-03-07 LAB — TSH: TSH: 0.778 u[IU]/mL (ref 0.450–4.500)

## 2023-03-09 ENCOUNTER — Encounter: Payer: Self-pay | Admitting: *Deleted

## 2023-03-12 ENCOUNTER — Telehealth: Payer: Self-pay | Admitting: Cardiology

## 2023-03-12 NOTE — Telephone Encounter (Signed)
Returned pt call. LVM to call back.

## 2023-03-12 NOTE — Telephone Encounter (Signed)
Patient is calling because she is going to fax over paperwork that she needs [place] to fill out. Patient would like to know if there is anything we would need her to sign. Please advise.

## 2023-03-13 NOTE — Telephone Encounter (Signed)
Patient calling back to see if her FMLA forms was received. Please advise

## 2023-03-13 NOTE — Telephone Encounter (Signed)
Called pt and LVM to call back. Will also send a MyChart  message.

## 2023-03-13 NOTE — Telephone Encounter (Signed)
Call to patient she is rushing asking if I see her paperwork.  Advised we just found what paperwork she was referring to by her call.  She ask multiples "do you see it , do you see it" . Advised three times I was looking .  She states well I have to get off this call so I may have to hang up and then ask again "do you see it"  At this time I noted I will look and we will send her a message via My chart. Call ended. Paperwork has not been received and will notify patient via My chart

## 2023-03-14 ENCOUNTER — Telehealth: Payer: Self-pay | Admitting: Cardiology

## 2023-03-14 NOTE — Telephone Encounter (Signed)
Patient is calling to see if received the [employer] Paperwork. Patient is needing the code for her sleep studies test as well.

## 2023-03-14 NOTE — Telephone Encounter (Signed)
Will forward this message to [facility] and his nurse. Pt is wanting to make sure the FMLA paperwork was received and if she can get the ICD10 code for her sleep test.

## 2023-03-15 ENCOUNTER — Telehealth: Payer: Self-pay | Admitting: Cardiology

## 2023-03-15 NOTE — Telephone Encounter (Signed)
See previous encounters.   Patient is following up. She is hopeful for a call back this afternoon if possible. Patient is requesting a code for sleep study and updates on short-term disability paperwork.  Patient would also like to know if she can do cardio for 30 minutes daily.

## 2023-03-15 NOTE — Telephone Encounter (Signed)
Will forward this to Dr. Ludwig Clarks nurse. Advised pt that she is out of the office this week but there are other nurses that are covering.

## 2023-03-15 NOTE — Telephone Encounter (Signed)
Spoke with patient and gave her the pin [id number] for the itamar device and verbalized understanding was indicated.

## 2023-03-16 NOTE — Telephone Encounter (Signed)
Returned call to pt. Informed pt of the ICD 10 code and informed her of the paperwork is in pending status. Pt verbalized understanding.

## 2023-03-16 NOTE — Telephone Encounter (Signed)
R06.83 is the ICD code for snoring that was used for visit when sleep study was ordered  Update on FMLA pending. Nothing under media. No notes in chart.

## 2023-03-20 ENCOUNTER — Telehealth: Payer: Self-pay | Admitting: Cardiology

## 2023-03-20 NOTE — Telephone Encounter (Signed)
Spoke with pt regarding disability paperwork. Explained that every location for paperwork has been checked and we do not physically have this paperwork. Pt is going to attempt to send a copy via mychart.   Pt states that she has forgotten the code provided to her for itamar sleep study. Documentation in EMR noted that pt doesn't need a prior auth and code was called to her back on 8/20. Code given to pt.   Pt verbalizes understanding.

## 2023-03-20 NOTE — Telephone Encounter (Signed)
Will forward this message to Dr. Jens Som and his Nurse. Pt has called several times.

## 2023-03-20 NOTE — Telephone Encounter (Signed)
Pt calling back to f/u on Disability paperwork. Pt states that forms has to be sent back to her employer by 9/5. Pt would like a callback as soon as possible regarding this matter. Please advise

## 2023-03-21 NOTE — Telephone Encounter (Signed)
Duplicate encounter on paperwork

## 2023-03-22 ENCOUNTER — Ambulatory Visit (HOSPITAL_COMMUNITY): Payer: 59 | Attending: Cardiology

## 2023-03-22 DIAGNOSIS — I471 Supraventricular tachycardia, unspecified: Secondary | ICD-10-CM | POA: Diagnosis present

## 2023-03-22 LAB — ECHOCARDIOGRAM COMPLETE
Area-P 1/2: 3.5 cm2
S' Lateral: 2.3 cm

## 2023-03-23 ENCOUNTER — Telehealth: Payer: Self-pay | Admitting: Cardiology

## 2023-03-23 NOTE — Telephone Encounter (Signed)
Spoke with patient and she states she is busy and will return our call

## 2023-03-23 NOTE — Telephone Encounter (Signed)
  Patient has seen that her Echo results were normal and she would like to know what Dr Jens Som thinks could have caused her tachycardia. Please advise.

## 2023-03-26 ENCOUNTER — Encounter: Payer: Self-pay | Admitting: *Deleted

## 2023-03-26 NOTE — Telephone Encounter (Signed)
Returned call to pt regarding her call about her question for Dr. Jens Som. LVM for pt to call back.

## 2023-03-27 NOTE — Telephone Encounter (Signed)
Unable to reach pt or leave a message  

## 2023-03-27 NOTE — Telephone Encounter (Signed)
Patient wants a call back to discuss echocardiogram test results and will she need to come off of her metoprolol succinate (TOPROL XL) 25 MG 24 hr tablet  medication and be on a heart health diet.  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

## 2023-03-27 NOTE — Telephone Encounter (Signed)
Returned call to pt. Attempt #3 LVM to call back.

## 2023-03-29 NOTE — Telephone Encounter (Signed)
Patient is returning call.  °

## 2023-03-29 NOTE — Telephone Encounter (Signed)
Patient starts with "I may hang up as I am busy at work".  This has happened before with her. Patient given echo results.  She sates no episodes of tachycardia at this time. She is rude and during conversation hangs up. Her question in the time we spoke was can she stop the metoprolol since Echo was normal  Tried to explain she may not be having episodes because of the medication but she ended call

## 2023-03-30 NOTE — Telephone Encounter (Signed)
Left message for patient with Dr Ronnald Nian recommendations.

## 2023-06-29 ENCOUNTER — Telehealth: Payer: Self-pay | Admitting: Cardiology

## 2023-06-29 NOTE — Telephone Encounter (Signed)
Pt c/o medication issue:  1. Name of Medication:   metoprolol succinate (TOPROL XL) 25 MG 24 hr tablet   2. How are you currently taking this medication (dosage and times per day)?   As prescribed  3. Are you having a reaction (difficulty breathing--STAT)?   No  4. What is your medication issue?   Patient stated her test results came back fine and wants to know if she still needs to take this medication. Patient stated can leave a detailed voice mail with instructions.

## 2023-06-29 NOTE — Telephone Encounter (Signed)
Attempted to call patient, no answer left message requesting a call back.  Rn would need to speak to patient and see how symptoms have been while on medication and send message to provider for input/advisement

## 2023-07-02 NOTE — Telephone Encounter (Signed)
 2nd attempt to call patient, no answer left message requesting a call back.

## 2023-07-03 NOTE — Telephone Encounter (Signed)
 3rd attempt to call patient, no answer, left message requesting a call back Nursing will await for patient to return call

## 2023-07-03 NOTE — Progress Notes (Signed)
HPI: Fu SVT.  Seen 8/24 with SVT.  Patient converted to sinus rhythm with 6 mg of adenosine.  HR 170.  Echocardiogram September 2024 showed normal LV function.  Since last seen the patient denies any dyspnea on exertion, orthopnea, PND, pedal edema, palpitations, syncope or chest pain.   Current Outpatient Medications  Medication Sig Dispense Refill   Calcium Carb-Cholecalciferol (CALCIUM-VITAMIN D3 PO) Take 1 tablet by mouth daily.     ibuprofen (ADVIL) 600 MG tablet Take 600 mg by mouth every 6 (six) hours as needed.     methocarbamol (ROBAXIN) 500 MG tablet Take 500 mg by mouth every 6 (six) hours as needed for muscle spasms.     metoprolol succinate (TOPROL XL) 25 MG 24 hr tablet Take 1 tablet (25 mg total) by mouth at bedtime. 90 tablet 3   No current facility-administered medications for this visit.     Past Medical History:  Diagnosis Date   Allergy    Arthritis    Phreesia 08/20/2020   Hypertension    Phreesia 08/20/2020   Mixed hyperlipidemia    Nut allergy    [place] nut    Past Surgical History:  Procedure Laterality Date   ANTERIOR CERVICAL CORPECTOMY N/A 10/15/2019   Procedure: ANTERIOR CERVICAL CORPECTOMY C5;  Surgeon: Venetia Night, MD;  Location: [facility] ORS;  Service: Neurosurgery;  Laterality: N/A;   ANTERIOR CERVICAL DECOMP/DISCECTOMY FUSION N/A 10/15/2019   Procedure: C6-7 ANTERIOR CERVICAL DISCECTOMY AND FUSION, C4-7 ANTERIOR SPINAL INSTRUMENTATION;  Surgeon: Venetia Night, MD;  Location: [facility] ORS;  Service: Neurosurgery;  Laterality: N/A;   COLONOSCOPY WITH PROPOFOL N/A 10/04/2020   Procedure: COLONOSCOPY WITH PROPOFOL;  Surgeon: Toney Reil, MD;  Location: [facility] ENDOSCOPY;  Service: Gastroenterology;  Laterality: N/A;   EYE SURGERY     tear duct repair   FRACTURE SURGERY     left foot fracture at 57 yrs old   TUBAL LIGATION      Social History   Socioeconomic History   Marital status: Legally Separated    Spouse name: Not on file    Number of children: 3   Years of education: Not on file   Highest education level: Not on file  Occupational History   Not on file  Tobacco Use   Smoking status: Never   Smokeless tobacco: Never  Vaping Use   Vaping status: Never Used  Substance and Sexual Activity   Alcohol use: Yes    Alcohol/week: 2.0 standard drinks of alcohol    Types: 1 Cans of beer, 1 Glasses of wine per week    Comment: socially   Drug use: No   Sexual activity: Not on file  Other Topics Concern   Not on file  Social History Narrative   Not on file   Social Drivers of Health   Financial Resource Strain: Not on file  Food Insecurity: Not on file  Transportation Needs: Not on file  Physical Activity: Not on file  Stress: Not on file  Social Connections: Unknown (11/24/2021)   [facility]   Social Network    Social Network: Not on file  Intimate Partner Violence: Unknown (10/21/2021)   [facility]   HITS    Physically Hurt: Not on file    Insult or Talk Down To: Not on file    Threaten Physical Harm: Not on file    Scream or Curse: Not on file    Family History  Problem Relation Age of Onset   Diabetes Mother    Kidney Stones Mother    Heart disease Mother    Cancer Father    Prostate cancer Father    Diabetes Maternal Grandmother    Heart disease Maternal Grandmother    Hypertension Maternal Grandmother    Diabetes Maternal Grandfather    Heart disease Maternal Grandfather    Hypertension Maternal Grandfather    Colon cancer Paternal Grandmother    Hypertension Paternal Grandmother     ROS: no fevers or chills, productive cough, hemoptysis, dysphasia, odynophagia, melena, hematochezia, dysuria, hematuria, rash, seizure activity, orthopnea, PND, pedal edema, claudication. Remaining systems are negative.  Physical Exam: Well-developed well-nourished in no acute distress.  Skin is warm and dry.  HEENT is normal.  Neck is  supple.  Chest is clear to auscultation with normal expansion.  Cardiovascular exam is regular rate and rhythm.  Abdominal exam nontender or distended. No masses palpated. Extremities show no edema. neuro grossly intact  A/P  1 supraventricular tachycardia-patient has had no recurrences since last office visit.  Will continue Toprol at present dose.  Will consider referral for ablation in the future if she has more frequent episodes.  Note LV function is normal.  2 hypertension-blood pressure controlled.  Continue present medications.  3 Hyperlipidemia-per primary care.  Olga Millers, MD

## 2023-07-03 NOTE — Telephone Encounter (Signed)
Patient identification verified by 2 forms. Marilynn Rail, RN    Received call from patient  Patient states:   -has appointment on 12/23 with Dr. Jens Som   -completed Echo, was informed results were normal   -unsure if she should continue Metoprolol given normal Echo   -Palpitation stopped when she started the metoprolol   -wanted to start taking Magnesium  Informed patient:   -Since palpitation improved with Metoprolol can continue Rx  During call patient stated she had to go, wanted to quickly know if she can take Magnesium  Advised patient to follow up with Dr. Jens Som at 12/23 appointment  Patient verbalized understanding and disconnected call

## 2023-07-09 ENCOUNTER — Encounter: Payer: Self-pay | Admitting: Cardiology

## 2023-07-09 ENCOUNTER — Ambulatory Visit: Payer: 59 | Attending: Cardiology | Admitting: Cardiology

## 2023-07-09 VITALS — BP 138/76 | HR 81 | Ht 64.0 in | Wt 201.0 lb

## 2023-07-09 DIAGNOSIS — I471 Supraventricular tachycardia, unspecified: Secondary | ICD-10-CM

## 2023-07-09 DIAGNOSIS — I10 Essential (primary) hypertension: Secondary | ICD-10-CM

## 2023-07-09 MED ORDER — METOPROLOL SUCCINATE ER 25 MG PO TB24: 25.0000 mg | ORAL_TABLET | Freq: Every day | ORAL | 3 refills | Status: DC

## 2023-07-09 NOTE — Patient Instructions (Signed)

## 2023-07-24 ENCOUNTER — Telehealth: Payer: Self-pay | Admitting: Cardiology

## 2023-07-24 NOTE — Telephone Encounter (Signed)
 Called patient and someone picked up the phone and never said never spoke. I heard talking in the background. Will try back later.

## 2023-07-24 NOTE — Telephone Encounter (Signed)
 Pt wants to know if she can take magnesium and drink Aswagandha Tea while taking Metoprolol

## 2023-07-24 NOTE — Telephone Encounter (Signed)
 Called and spoke to patient. Verified name and DOB. Below message relayed.    Pavero, Cristal Deer, [facility]  Should not be an issue

## 2023-07-24 NOTE — Telephone Encounter (Signed)
 Pt returning call

## 2023-08-14 ENCOUNTER — Telehealth: Payer: Self-pay | Admitting: Cardiology

## 2023-08-14 NOTE — Telephone Encounter (Signed)
Pt asked if its okay to for her to take Vital Protein Collage peptides 20g.

## 2023-08-14 NOTE — Telephone Encounter (Signed)
Noted  FWD to pharmacy team for input/advisement

## 2023-08-15 NOTE — Telephone Encounter (Signed)
Attempted to call patient, no answer left message requesting a call back.

## 2023-08-15 NOTE — Telephone Encounter (Signed)
Follow Up:     Patient is returning a call from today.

## 2023-08-15 NOTE — Telephone Encounter (Signed)
Left message on answer machine - it is okay the take Vital collagen protein Any question may call back

## 2023-08-15 NOTE — Telephone Encounter (Signed)
  Should be fine to take  Beverly Hughes, [facility]

## 2023-12-25 NOTE — Progress Notes (Unsigned)
 Referring Physician:  Senaida Dama, NP [address] [place],  [place] [address]  Primary Physician:  Senaida Dama, NP  History of Present Illness: 12/26/2023 Beverly Hughes has a history of HTN, hyperlipidemia.   She is s/p C5 corpectomy with ACDF C4-C7 by Dr. Mont Antis on 10/15/19 for cervical myelopathy.   Last seen by me on 10/03/22 for constant neck pain. PT was recommended and she did not go.   She is here for follow up.   She has constant pain in her neck that is worse with sitting- she notes pressure in her entire spine. Pressure when she sits starts in lumbar spine and radiates up into her neck. She feels plate is bulging out the front of her throat when she sits- she feels like she is choking on her food frequently. Has some trouble swallowing pills- feels like this is getting worse. No arm pain. She has occasional numbness/tingling in her left arm with some weakness.   No dexterity issues or new balance issues.   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   Had normal swallowing study on 01/22/20.   She does not smoke.   Bowel/Bladder Dysfunction: none  Conservative measures:  Physical therapy: has not participated in within the past year Multimodal medical therapy including regular antiinflammatories: flexeril , motrin , robaxin , ultram   Injections: has not received any epidural steroid injections  Past Surgery:  C5 corpectomy with ACDF C4-C7 by Dr. Mont Antis on 10/15/19 for cervical myelopathy  Beverly Hughes has no symptoms of cervical myelopathy.  The symptoms are causing a significant impact on the patient's life.   Review of Systems:  A 10 point review of systems is negative, except for the pertinent positives and negatives detailed in the HPI.  Past Medical History: Past Medical History:  Diagnosis Date   Allergy    Arthritis    Phreesia 08/20/2020   Hypertension    Phreesia 08/20/2020   Mixed hyperlipidemia    Nut allergy     [place] nut    Past Surgical History: Past Surgical History:  Procedure Laterality Date   ANTERIOR CERVICAL CORPECTOMY N/A 10/15/2019   Procedure: ANTERIOR CERVICAL CORPECTOMY C5;  Surgeon: Jodeen Munch, MD;  Location: [facility] ORS;  Service: Neurosurgery;  Laterality: N/A;   ANTERIOR CERVICAL DECOMP/DISCECTOMY FUSION N/A 10/15/2019   Procedure: C6-7 ANTERIOR CERVICAL DISCECTOMY AND FUSION, C4-7 ANTERIOR SPINAL INSTRUMENTATION;  Surgeon: Jodeen Munch, MD;  Location: [facility] ORS;  Service: Neurosurgery;  Laterality: N/A;   COLONOSCOPY WITH PROPOFOL  N/A 10/04/2020   Procedure: COLONOSCOPY WITH PROPOFOL ;  Surgeon: Selena Daily, MD;  Location: [facility] ENDOSCOPY;  Service: Gastroenterology;  Laterality: N/A;   EYE SURGERY     tear duct repair   FRACTURE SURGERY     left foot fracture at 58 yrs old   TUBAL LIGATION      Allergies: Allergies as of 12/26/2023 - Review Complete 12/26/2023  Allergen Reaction Noted   Other Anaphylaxis 12/31/2018   Codeine sulfate  07/26/2013    Medications: Outpatient Encounter Medications as of 12/26/2023  Medication Sig   ibuprofen  (ADVIL ) 600 MG tablet Take 600 mg by mouth every 6 (six) hours as needed.   [DISCONTINUED] methocarbamol  (ROBAXIN ) 500 MG tablet Take 500 mg by mouth every 6 (six) hours as needed for muscle spasms.   methocarbamol  (ROBAXIN ) 500 MG tablet Take 1 tablet (500 mg total) by mouth every 8 (eight) hours as needed for muscle spasms.   [DISCONTINUED] Calcium Carb-Cholecalciferol (CALCIUM-VITAMIN  D3 PO) Take 1 tablet by mouth daily.   [DISCONTINUED] metoprolol  succinate (TOPROL  XL) 25 MG 24 hr tablet Take 1 tablet (25 mg total) by mouth at bedtime.   No facility-administered encounter medications on file as of 12/26/2023.    Social History: Social History   Tobacco Use   Smoking status: Never   Smokeless tobacco: Never  Vaping Use   Vaping status: Never Used  Substance Use Topics   Alcohol use: Yes    Alcohol/week: 2.0  standard drinks of alcohol    Types: 1 Cans of beer, 1 Glasses of wine per week    Comment: socially   Drug use: No    Family Medical History: Family History  Problem Relation Age of Onset   Diabetes Mother    Kidney Stones Mother    Heart disease Mother    Cancer Father    Prostate cancer Father    Diabetes Maternal Grandmother    Heart disease Maternal Grandmother    Hypertension Maternal Grandmother    Diabetes Maternal Grandfather    Heart disease Maternal Grandfather    Hypertension Maternal Grandfather    Colon cancer Paternal Grandmother    Hypertension Paternal Grandmother     Physical Examination: Vitals:   12/26/23 1335  BP: (!) 148/96      Awake, alert, oriented to person, place, and time.  Speech is clear and fluent. Fund of knowledge is appropriate.   Cranial Nerves: Pupils equal round and reactive to light.  Facial tone is symmetric.    Well healed cervical incision.   No abnormal lesions on exposed skin.   Strength: Side Biceps Triceps Deltoid Interossei Grip Wrist Ext. Wrist Flex.  R 5 5 5 5 5 5 5   L 5 5 5 5 5 5 5    Side Iliopsoas Quads Hamstring PF DF EHL  R 5 5 5 5 5 5   L 5 5 5 5 5 5    Reflexes are 2+ and symmetric at the biceps, triceps, brachioradialis, patella and achilles.   Hoffman's is absent bilaterally.  Clonus is not present.   Bilateral upper and lower extremity sensation is intact to light touch.     Gait is normal.     Medical Decision Making  Imaging: None  Assessment and Plan: Beverly Hughes is a pleasant 58 y.o. female is s/p C5 corpectomy with ACDF C4-C7 by Dr. Mont Antis on 10/15/19 for cervical myelopathy. Had some swallowing difficulty that he felt was related to esophageal manipulation. Had normal swallowing study on 01/22/20.   She has constant pain in her neck that is worse with sitting- she notes pressure in her entire spine. Pressure when she sits starts in lumbar spine and radiates up into her neck. She feels plate is  bulging out the front of her throat when she sits- she feels like she is choking on her food frequently. No arm pain. She has occasional numbness/tingling in her left arm with some weakness.   No recent cervical imaging.   Treatment options discussed with patient and following plan made:   - Cervical xrays ordered. Will message her with results.  - Refill of robaxin  given to take prn spasms. Reviewed dosing and side effects. She knows it may make her sleepy.  - Referral to ENT [facility]) to evaluate swallowing issues.  - Will determine follow up once her xrays are back.  - Note given keeping her out of work for next 6 weeks while she is undergoing above  workup. 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    BP was elevated. No symptoms of chest pain, shortness of breath, blurry vision, or headaches. She will recheck at home and call PCP if not improved. If she develops CP, SOB, blurry vision, or headaches, then she will go to ED.     I spent a total of 35 minutes in face-to-face and non-face-to-face activities related to this patient's care today including review of outside records, review of imaging, review of symptoms, physical exam, discussion of differential diagnosis, discussion of treatment options, and documentation.   Lucetta Russel PA-C Dept. of Neurosurgery

## 2023-12-26 ENCOUNTER — Ambulatory Visit (INDEPENDENT_AMBULATORY_CARE_PROVIDER_SITE_OTHER): Admitting: Orthopedic Surgery

## 2023-12-26 ENCOUNTER — Encounter: Payer: Self-pay | Admitting: Orthopedic Surgery

## 2023-12-26 ENCOUNTER — Ambulatory Visit
Admission: RE | Admit: 2023-12-26 | Discharge: 2023-12-26 | Disposition: A | Discharge status: Home / Self Care | Attending: Orthopedic Surgery | Admitting: Orthopedic Surgery

## 2023-12-26 ENCOUNTER — Ambulatory Visit
Admission: RE | Admit: 2023-12-26 | Discharge: 2023-12-26 | Disposition: A | Discharge status: Home / Self Care | Source: Ambulatory Visit | Attending: Orthopedic Surgery | Admitting: Orthopedic Surgery

## 2023-12-26 VITALS — BP 144/92 | Ht 64.0 in | Wt 201.0 lb

## 2023-12-26 DIAGNOSIS — R1310 Dysphagia, unspecified: Secondary | ICD-10-CM | POA: Diagnosis present

## 2023-12-26 DIAGNOSIS — M542 Cervicalgia: Secondary | ICD-10-CM | POA: Diagnosis present

## 2023-12-26 DIAGNOSIS — Z981 Arthrodesis status: Secondary | ICD-10-CM

## 2023-12-26 MED ORDER — METHOCARBAMOL 500 MG PO TABS
500.0000 mg | ORAL_TABLET | Freq: Three times a day (TID) | ORAL | 0 refills | Status: AC | PRN
Start: 2023-12-26 — End: ?

## 2023-12-26 NOTE — Patient Instructions (Signed)
 It was so nice to see you today. Thank you so much for coming in.    I ordered xrays of your neck. You can get these at [facility] (building with the white pillars) off of [address]. The address is [address], [place], [place] [address]. You do not need any appointment. I will message you with results.   I also sent a prescription for methocarbamol  to help with muscle spasms. Use only as needed and be careful, this can make you sleepy.   I want you to see ENT (Dr. Larkin Plumb) in [place] to evaluate your swallowing. They should call you to schedule. See contact info below.   Once I review your xrays, I will sent a message. It can take 2-3 weeks for them to be read by radiology. We can regroup regarding follow up at that time.   Your blood pressure was elevated today. I want you to recheck it at home and follow up with your PCP if it remains high. If you have any chest pain, shortness of breath, blurry vision, or headaches then you need to go to ED.    Please do not hesitate to call if you have any questions or concerns. You can also message me in MyChart.   Lucetta Russel PA-C [phone]     The physicians and staff at [facility] at [facility] are committed to providing excellent care. You may receive a survey asking for feedback about your experience at our office. We value you your feedback and appreciate you taking the time to to fill it out. The [facility] leadership team is also available to discuss your experience in person, feel free to contact us  [phone].

## 2024-01-10 ENCOUNTER — Ambulatory Visit: Payer: Self-pay | Admitting: Neurosurgery

## 2024-01-14 ENCOUNTER — Institutional Professional Consult (permissible substitution) (INDEPENDENT_AMBULATORY_CARE_PROVIDER_SITE_OTHER): Admitting: Otolaryngology

## 2024-01-14 ENCOUNTER — Telehealth (INDEPENDENT_AMBULATORY_CARE_PROVIDER_SITE_OTHER): Payer: Self-pay

## 2024-01-14 NOTE — Telephone Encounter (Signed)
Left a message for patient to give us a call back.  

## 2024-01-14 NOTE — Telephone Encounter (Signed)
 Please make her a f/u with me first week or two of August. Can give her note keeping her out until then, but if she ready to go back prior to that she can let me know.   Thanks.

## 2024-01-24 ENCOUNTER — Ambulatory Visit: Admitting: Orthopedic Surgery

## 2024-02-10 NOTE — Progress Notes (Signed)
 Referring Physician:  Lorren Greig PARAS, NP [address] [place],  [place] [address]  Primary Physician:  Lorren Greig PARAS, NP  History of Present Illness: 02/19/2024 Ms. Beverly Hughes has a history of HTN, hyperlipidemia.   She is s/p C5 corpectomy with ACDF C4-C7 by Dr. Clois on 10/15/19 for cervical myelopathy.   Last seen by me on 12/26/23 for constant neck pain with pressure in her entire spine that was worse with sitting. Updated xrays looked okay.   She was started on robaxin  and referred to ENT- she saw Dr. Soldatova on 03/19/24. She was written out of work.   She is here for follow up.   She is about the same. She continues with constant pain in her neck that is worse with sitting. She feels like she is choking on her food frequently. Has some trouble swallowing pills- feels like this is getting worse. No arm pain. She has occasional numbness/tingling in her left arm with some weakness.   She had a flare up of LBP last week- she has intermittent pain in her lower back that feels like pressure. No leg pain. She has numbness and tingling in both great toes. She has occasional weakness in her legs.   No dexterity issues or new balance issues.   Had normal swallowing study on 01/22/20.   She does not smoke.   Bowel/Bladder Dysfunction: none  Conservative measures:  Physical therapy: has not participated in within the past year Multimodal medical therapy including regular antiinflammatories: flexeril , motrin , robaxin , ultram   Injections: has not received any epidural steroid injections  Past Surgery:  C5 corpectomy with ACDF C4-C7 by Dr. Clois on 10/15/19 for cervical myelopathy  Yenesis D Angus has no symptoms of cervical myelopathy.  The symptoms are causing a significant impact on the patient's life.   Review of Systems:  A 10 point review of systems is negative, except for the pertinent positives and negatives detailed in the HPI.  Past Medical  History: Past Medical History:  Diagnosis Date   Allergy    Arthritis    Phreesia 08/20/2020   Hypertension    Phreesia 08/20/2020   Mixed hyperlipidemia    Nut allergy    estonia nut    Past Surgical History: Past Surgical History:  Procedure Laterality Date   ANTERIOR CERVICAL CORPECTOMY N/A 10/15/2019   Procedure: ANTERIOR CERVICAL CORPECTOMY C5;  Surgeon: Clois Fret, MD;  Location: [facility] ORS;  Service: Neurosurgery;  Laterality: N/A;   ANTERIOR CERVICAL DECOMP/DISCECTOMY FUSION N/A 10/15/2019   Procedure: C6-7 ANTERIOR CERVICAL DISCECTOMY AND FUSION, C4-7 ANTERIOR SPINAL INSTRUMENTATION;  Surgeon: Clois Fret, MD;  Location: [facility] ORS;  Service: Neurosurgery;  Laterality: N/A;   COLONOSCOPY WITH PROPOFOL  N/A 10/04/2020   Procedure: COLONOSCOPY WITH PROPOFOL ;  Surgeon: Unk Corinn Skiff, MD;  Location: [facility] ENDOSCOPY;  Service: Gastroenterology;  Laterality: N/A;   EYE SURGERY     tear duct repair   FRACTURE SURGERY     left foot fracture at 58 yrs old   TUBAL LIGATION      Allergies: Allergies as of 02/19/2024 - Review Complete 02/19/2024  Allergen Reaction Noted   [place] nut (berthollefia [place]) Anaphylaxis 01/14/2024   Other Anaphylaxis 12/31/2018   Codeine sulfate  07/26/2013    Medications: Outpatient Encounter Medications as of 02/19/2024  Medication Sig   ibuprofen  (ADVIL ) 600 MG tablet Take 600 mg by mouth every 6 (six) hours as needed.   methocarbamol  (ROBAXIN ) 500 MG tablet Take 1 tablet (500 mg  total) by mouth every 8 (eight) hours as needed for muscle spasms.   No facility-administered encounter medications on file as of 02/19/2024.    Social History: Social History   Tobacco Use   Smoking status: Never   Smokeless tobacco: Never  Vaping Use   Vaping status: Never Used  Substance Use Topics   Alcohol use: Yes    Alcohol/week: 2.0 standard drinks of alcohol    Types: 1 Cans of beer, 1 Glasses of wine per week    Comment: socially    Drug use: No    Family Medical History: Family History  Problem Relation Age of Onset   Diabetes Mother    Kidney Stones Mother    Heart disease Mother    Cancer Father    Prostate cancer Father    Diabetes Maternal Grandmother    Heart disease Maternal Grandmother    Hypertension Maternal Grandmother    Diabetes Maternal Grandfather    Heart disease Maternal Grandfather    Hypertension Maternal Grandfather    Colon cancer Paternal Grandmother    Hypertension Paternal Grandmother     Physical Examination: Vitals:   02/19/24 1131  BP: 128/82    Awake, alert, oriented to person, place, and time.  Speech is clear and fluent. Fund of knowledge is appropriate.   Cranial Nerves: Pupils equal round and reactive to light.  Facial tone is symmetric.    Well healed cervical incision.   No abnormal lesions on exposed skin.   Strength: Side Biceps Triceps Deltoid Interossei Grip Wrist Ext. Wrist Flex.  R 5 5 5 5 5 5 5   L 5 5 5 5 5 5 5    Side Iliopsoas Quads Hamstring PF DF EHL  R 5 5 5 5 5 5   L 5 5 5 5 5 5    Reflexes are 2+ and symmetric at the biceps, triceps, brachioradialis, patella and achilles.   Hoffman's is absent bilaterally.  Clonus is not present.   Bilateral upper and lower extremity sensation is intact to light touch.     No pain with IR/ER of both hips.   Gait is normal.     Medical Decision Making  Imaging: Lumbar xrays dated 10/03/22:   FINDINGS: Mild curvature of the lumbar spine, apex to the left. 4 mm retrolisthesis of L3 versus L4. 4 mm anterolisthesis of L4 versus L5. No other malalignment. No fractures. Mild multilevel degenerative disc disease with a few tiny anterior osteophytes. No loss of disc height identified. Lower lumbar facet degenerative changes are noted.   IMPRESSION: 1. Degenerative changes as above. 2. 4 mm retrolisthesis of L3 versus L4 and 4 mm anterolisthesis of L4 versus L5.     Electronically Signed   By: Alm Pouch III M.D.   On: 10/05/2022 13:58  I have personally reviewed the images and agree with the above interpretation.   Assessment and Plan: Ms. Burandt is a pleasant 58 y.o. female is s/p C5 corpectomy with ACDF C4-C7 by Dr. Clois on 10/15/19 for cervical myelopathy. Had some swallowing difficulty that he felt was related to esophageal manipulation. Had normal swallowing study on 01/22/20.   She continues with constant pain in her neck that is worse with sitting. She feels like she is choking on her food frequently. No arm pain. She has occasional numbness/tingling in her left arm with some weakness. No dexterity issues or new balance issues.   Previous cervical xrays showed fusion C4-C7 with diffuse spondylosis and DDD.   She  had a flare up of LBP last week- she has intermittent pain in her lower back that feels like pressure. No leg pain. She has numbness and tingling in both great toes. She has occasional weakness in her legs.   Lumbar xrays from last year showed lumbar spondylosis with mild DDD, retrolisthesis L3-L4 and slip L4-L5.   Treatment options discussed with patient and following plan made:   - Follow up with ENT [facility]) as scheduled to evaluate swallowing issues.  - MRI of lumbar spine to further evaluate chronic LBP. Will get lumbar xrays with flex/ext views. She requests OPEN MRI at [facility] in [place].  - Will keep her out of work for now- will revisit once I get lumbar imaging results and she sees ENT. Note given.  - Will schedule MyChart visit to review MRI and xray results once I have them back.   I spent a total of 30 minutes in face-to-face and non-face-to-face activities related to this patient's care today including review of outside records, review of imaging, review of symptoms, physical exam, discussion of differential diagnosis, discussion of treatment options, and documentation.   Glade Boys PA-C Dept. of Neurosurgery

## 2024-02-14 ENCOUNTER — Institutional Professional Consult (permissible substitution) (INDEPENDENT_AMBULATORY_CARE_PROVIDER_SITE_OTHER): Admitting: Otolaryngology

## 2024-02-19 ENCOUNTER — Ambulatory Visit (INDEPENDENT_AMBULATORY_CARE_PROVIDER_SITE_OTHER): Admitting: Orthopedic Surgery

## 2024-02-19 ENCOUNTER — Encounter: Payer: Self-pay | Admitting: Orthopedic Surgery

## 2024-02-19 VITALS — BP 128/82 | Ht 64.0 in | Wt 200.0 lb

## 2024-02-19 DIAGNOSIS — R1310 Dysphagia, unspecified: Secondary | ICD-10-CM | POA: Diagnosis not present

## 2024-02-19 DIAGNOSIS — M4316 Spondylolisthesis, lumbar region: Secondary | ICD-10-CM

## 2024-02-19 DIAGNOSIS — M47816 Spondylosis without myelopathy or radiculopathy, lumbar region: Secondary | ICD-10-CM | POA: Diagnosis not present

## 2024-02-19 DIAGNOSIS — Z981 Arthrodesis status: Secondary | ICD-10-CM

## 2024-02-19 DIAGNOSIS — M5136 Other intervertebral disc degeneration, lumbar region with discogenic back pain only: Secondary | ICD-10-CM | POA: Diagnosis not present

## 2024-02-19 NOTE — Patient Instructions (Signed)
 It was so nice to see you today. Thank you so much for coming in.    Keep your scheduled appointment with ENT.   Your lower back xrays showed some wear and tear with slip at L4-L5.   I want to get an MRI of your lower back to look into things further. We will get this approved through your insurance and [facility] in [place] will call you to schedule the appointment. Ask about your patient responsibility. You do not need to pay this prior to getting MRI, they can bill you.   MRI will be OPEN. Remind them to do xrays when you have the MRI done. Let me know when your MRI is done.   After you have the MRI and xrays, it can take 14-28 days for me to get the results back. If I don't have them in 2 weeks, we will call to try to get the results.   Once I have the results, we will call you to schedule a follow up MyChart visit with me to review them.   Please do not hesitate to call if you have any questions or concerns. You can also message me in MyChart.   Glade Boys PA-C [phone]     The physicians and staff at [facility] Neurosurgery at [facility] are committed to providing excellent care. You may receive a survey asking for feedback about your experience at our office. We value you your feedback and appreciate you taking the time to to fill it out. The [facility] leadership team is also available to discuss your experience in person, feel free to contact us  [phone].

## 2024-03-19 ENCOUNTER — Encounter (INDEPENDENT_AMBULATORY_CARE_PROVIDER_SITE_OTHER): Payer: Self-pay | Admitting: Otolaryngology

## 2024-03-19 ENCOUNTER — Ambulatory Visit (INDEPENDENT_AMBULATORY_CARE_PROVIDER_SITE_OTHER): Admitting: Otolaryngology

## 2024-03-19 VITALS — BP 132/79 | HR 77

## 2024-03-19 DIAGNOSIS — K219 Gastro-esophageal reflux disease without esophagitis: Secondary | ICD-10-CM

## 2024-03-19 DIAGNOSIS — R1310 Dysphagia, unspecified: Secondary | ICD-10-CM | POA: Diagnosis not present

## 2024-03-19 MED ORDER — FAMOTIDINE 20 MG PO TABS: 20.0000 mg | ORAL_TABLET | Freq: Two times a day (BID) | ORAL | 3 refills | Status: AC

## 2024-03-19 NOTE — Patient Instructions (Signed)

## 2024-03-19 NOTE — Progress Notes (Signed)
 ENT CONSULT:  Reason for Consult: dysphagia    HPI: Discussed the use of AI scribe software for clinical note transcription with the patient, who gave verbal consent to proceed.  History of Present Illness Beverly Hughes is a 58 year old female with hx of ACDF 2021 C4-7 who presents with trouble swallowing after neck surgery.  She experiences difficulty swallowing both solids and liquids, which began after her neck surgery. The sensation is described as food or drink feeling 'caught' in her throat, requiring her to hold her head up while eating or drinking. She cannot swallow with her head down and must keep her face straight. This issue has persisted since her surgery.  She also experiences pressure in her spine when sitting for long periods, which she describes as feeling like it 'chokes' her and makes it difficult to breathe. This pressure seems to radiate to her neck, exacerbating her swallowing difficulties.  No history of heartburn or indigestion is reported, and she has not been on medication for these issues. Her diet includes healthy foods such as green vegetables, citrus fruits, and juices, and she avoids alcohol except for occasional wine. She believes these dietary choices help with digestion.   Records Reviewed:  Glade Boys PA-C office visit Neurosurgery 02/19/24  Ms. Beverly Hughes has a history of HTN, hyperlipidemia.    She is s/p C5 corpectomy with ACDF C4-C7 by Dr. Clois on 10/15/19 for cervical myelopathy.    Last seen by me on 12/26/23 for constant neck pain with pressure in her entire spine that was worse with sitting. Updated xrays looked okay.    She was started on robaxin  and referred to ENT- she saw Dr. Jamine Wingate on 03/19/24. She was written out of work.    She is here for follow up.    She is about the same. She continues with constant pain in her neck that is worse with sitting. She feels like she is choking on her food frequently. Has some trouble  swallowing pills- feels like this is getting worse. No arm pain. She has occasional numbness/tingling in her left arm with some weakness.    She had a flare up of LBP last week- she has intermittent pain in her lower back that feels like pressure. No leg pain. She has numbness and tingling in both great toes. She has occasional weakness in her legs.      Past Medical History:  Diagnosis Date   Allergy    Arthritis    Phreesia 08/20/2020   Hypertension    Phreesia 08/20/2020   Mixed hyperlipidemia    Nut allergy    estonia nut    Past Surgical History:  Procedure Laterality Date   ANTERIOR CERVICAL CORPECTOMY N/A 10/15/2019   Procedure: ANTERIOR CERVICAL CORPECTOMY C5;  Surgeon: Clois Fret, MD;  Location: [facility] ORS;  Service: Neurosurgery;  Laterality: N/A;   ANTERIOR CERVICAL DECOMP/DISCECTOMY FUSION N/A 10/15/2019   Procedure: C6-7 ANTERIOR CERVICAL DISCECTOMY AND FUSION, C4-7 ANTERIOR SPINAL INSTRUMENTATION;  Surgeon: Clois Fret, MD;  Location: [facility] ORS;  Service: Neurosurgery;  Laterality: N/A;   COLONOSCOPY WITH PROPOFOL  N/A 10/04/2020   Procedure: COLONOSCOPY WITH PROPOFOL ;  Surgeon: Unk Corinn Skiff, MD;  Location: [facility] ENDOSCOPY;  Service: Gastroenterology;  Laterality: N/A;   EYE SURGERY     tear duct repair   FRACTURE SURGERY     left foot fracture at 58 yrs old   TUBAL LIGATION      Family History  Problem Relation Age of Onset  Diabetes Mother    Kidney Stones Mother    Heart disease Mother    Cancer Father    Prostate cancer Father    Diabetes Maternal Grandmother    Heart disease Maternal Grandmother    Hypertension Maternal Grandmother    Diabetes Maternal Grandfather    Heart disease Maternal Grandfather    Hypertension Maternal Grandfather    Colon cancer Paternal Grandmother    Hypertension Paternal Grandmother     Social History:  reports that she has never smoked. She has never used smokeless tobacco. She reports current alcohol use  of about 2.0 standard drinks of alcohol per week. She reports that she does not use drugs.  Allergies:  Allergies  Allergen Reactions   [place] Nut (Berthollefia [place]) Anaphylaxis   Other Anaphylaxis    [place] nut   Codeine Sulfate     Dizziness, risk of falling    Medications: I have reviewed the patient's current medications.  The PMH, PSH, Medications, Allergies, and SH were reviewed and updated.  ROS: Constitutional: Negative for fever, weight loss and weight gain. Cardiovascular: Negative for chest pain and dyspnea on exertion. Respiratory: Is not experiencing shortness of breath at rest. Gastrointestinal: Negative for nausea and vomiting. Neurological: Negative for headaches. Psychiatric: The patient is not nervous/anxious  Blood pressure 132/79, pulse 77, SpO2 97%. There is no height or weight on file to calculate BMI.  PHYSICAL EXAM:  Exam: General: Well-developed, well-nourished Respiratory Respiratory effort: Equal inspiration and expiration without stridor Cardiovascular Peripheral Vascular: Warm extremities with equal color/perfusion Eyes: No nystagmus with equal extraocular motion bilaterally Neuro/Psych/Balance: Patient oriented to person, place, and time; Appropriate mood and affect; Gait is intact with no imbalance; Cranial nerves I-XII are intact Head and Face Inspection: Normocephalic and atraumatic without mass or lesion Palpation: Facial skeleton intact without bony stepoffs Salivary Glands: No mass or tenderness Facial Strength: Facial motility symmetric and full bilaterally ENT Pinna: External ear intact and fully developed External canal: Canal is patent with intact skin Tympanic Membrane: Clear and mobile External Nose: No scar or anatomic deformity Internal Nose: Septum is deviated to the left. No polyp, or purulence. Mucosal edema and erythema present.  Bilateral inferior turbinate hypertrophy.  Lips, Teeth, and gums: Mucosa and teeth intact  and viable TMJ: No pain to palpation with full mobility Oral cavity/oropharynx: No erythema or exudate, no lesions present Nasopharynx: No mass or lesion with intact mucosa Hypopharynx: Intact mucosa without pooling of secretions Larynx Glottic: Full true vocal cord mobility without lesion or mass Supraglottic: Normal appearing epiglottis and AE folds Interarytenoid Space: Moderate pachydermia&edema Subglottic Space: Patent without lesion or edema Neck Neck and Trachea: Midline trachea without mass or lesion Thyroid: No mass or nodularity Lymphatics: No lymphadenopathy  Procedure: Preoperative diagnosis: dysphagia   Postoperative diagnosis:   Same + GERD LPR  Procedure: Flexible fiberoptic laryngoscopy  Surgeon: Elena Larry, MD  Anesthesia: Topical lidocaine  and Afrin Complications: None Condition is stable throughout exam  Indications and consent:  The patient presents to the clinic with above symptoms. Indirect laryngoscopy view was incomplete. Thus it was recommended that they undergo a flexible fiberoptic laryngoscopy. All of the risks, benefits, and potential complications were reviewed with the patient preoperatively and verbal informed consent was obtained.  Procedure: The patient was seated upright in the clinic. Topical lidocaine  and Afrin were applied to the nasal cavity. After adequate anesthesia had occurred, I then proceeded to pass the flexible telescope into the nasal cavity. The nasal cavity was patent without  rhinorrhea or polyp. The nasopharynx was also patent without mass or lesion. The base of tongue was visualized and was normal. There were no signs of pooling of secretions in the piriform sinuses. The true vocal folds were mobile bilaterally. There were no signs of glottic or supraglottic mucosal lesion or mass. There was moderate interarytenoid pachydermia and post cricoid edema. The telescope was then slowly withdrawn and the patient tolerated the procedure  throughout.      Studies Reviewed: CXR 03/03/24 FINDINGS: Cardiac and mediastinal contours within normal limits. Visualized lungs are clear, evaluation of the left hemithorax somewhat limited due to overlying pacer pads. No evidence of pleural effusion or pneumothorax.   IMPRESSION: No acute cardiopulmonary abnormality.  Assessment/Plan: Encounter Diagnoses  Name Primary?   Dysphagia, unspecified type Yes   Chronic GERD     Assessment and Plan Assessment & Plan Dysphagia Ddx includes oropharyngeal vs esophageal dysphagia. Hx of ACDF C4-7 in 2021.  Scope exam with mobile vocal cords and changes c/w GERD LPR. No weight loss, tolerating regular diet.  - Order swallow study to evaluate swallowing - Schedule follow-up in a couple of months to review swallow study results.  GERD LPR Changes c/w GERD LPR on scope exam today - Pepcid  20 mg BID  -  Reflux Gourmet after meals - diet and lifestyle changes to minimize GERD - Refer to [employer] blog for dietary and lifestyle modifications/reflux cook book   Thank you for allowing me to participate in the care of this patient. Please do not hesitate to contact me with any questions or concerns.   Elena Larry, MD Otolaryngology [facility] Phone: [phone] Fax: [phone]    03/19/2024, 2:05 PM

## 2024-03-20 ENCOUNTER — Other Ambulatory Visit (HOSPITAL_COMMUNITY): Payer: Self-pay | Admitting: Otolaryngology

## 2024-03-20 DIAGNOSIS — R1310 Dysphagia, unspecified: Secondary | ICD-10-CM

## 2024-03-22 ENCOUNTER — Encounter: Payer: Self-pay | Admitting: Cardiology

## 2024-03-24 NOTE — Telephone Encounter (Signed)
 Noted

## 2024-03-24 NOTE — Telephone Encounter (Signed)
 Spoke with patient via phone, she is having her employer fax something over.

## 2024-04-08 ENCOUNTER — Inpatient Hospital Stay
Admission: RE | Admit: 2024-04-08 | Discharge: 2024-04-08 | Disposition: A | Discharge status: Home / Self Care | Payer: Self-pay | Source: Ambulatory Visit | Attending: Orthopedic Surgery | Admitting: Orthopedic Surgery

## 2024-04-08 ENCOUNTER — Other Ambulatory Visit: Payer: Self-pay | Admitting: Family Medicine

## 2024-04-08 DIAGNOSIS — Z049 Encounter for examination and observation for unspecified reason: Secondary | ICD-10-CM

## 2024-04-09 NOTE — Progress Notes (Signed)
 My Chart Video Visit- Progress Note:  Referring Physician:  Lorren Greig PARAS, NP [address] [place],  [place] [address]  Primary Physician:  Lorren Greig PARAS, NP  This visit was performed via MyChart/video.   Patient location: home Provider location: office  I spent a total of 26 minutes non-face-to-face activities for this visit on the date of this encounter including review of current clinical condition and response to treatment.    Patient has given verbal consent to this MyChart video visit and we reviewed the limitations of a MyChart video visit. Patient wishes to proceed.    Chief Complaint:  review imaging  History of Present Illness: Beverly Hughes is a 58 y.o. female has a history of HTN, hyperlipidemia.    She is s/p C5 corpectomy with ACDF C4-C7 by Dr. Clois on 10/15/19 for cervical myelopathy.    Last seen by me on 02/19/24 for constant neck pain with pressure in her entire spine that was worse with sitting. Previous cervical xrays showed fusion C4-C7 with diffuse spondylosis and DDD.   She was to follow up with ENT regarding swallowing issues. Swallowing study was ordered. She was diagnosed with GERD as well. Advised to take pepcid .   She also had flare up of intermittent LBP at her last visit. She has known lumbar spondylosis with mild DDD, retrolisthesis L3-L4 and slip L4-L5.   Mychart visit scheduled to review her lumbar imaging.   She continues with intermittent LBP with intermittent posterior left leg pain to her knee. Pain is worse with sitting. She has numbness and tingling in both great toes. No weakness in her legs.   She continues with constant pain in her neck that is worse with sitting as well. She feels like she is choking on her food frequently.  She has occasional numbness/tingling in her left arm with some weakness. She has some sharp pain in between her shoulder blades as well.    No dexterity issues or new balance issues.     She does not smoke.   No bowel or bladder issues.    Conservative measures:  Physical therapy: has not participated in within the past year Multimodal medical therapy including regular antiinflammatories: flexeril , motrin , robaxin , ultram   Injections: has not received any epidural steroid injections   Past Surgery:  C5 corpectomy with ACDF C4-C7 by Dr. Clois on 10/15/19 for cervical myelopathy   Exam: General: Patient is well developed, well nourished, calm, collected, and in no apparent distress. Attention to examination is appropriate.  Respiratory: Patient is breathing without any difficulty.    Awake, alert, oriented to person, place, and time.  Speech is clear and fluent. Fund of knowledge is appropriate.     Imaging: Lumbar xrays dated 04/01/24:  FINDINGS: No fracture identified. Grade 1 anterolisthesis and mild disc space narrowing L4-5. Mild degenerative changes. Mild leftward curvature lumbar spine. Mild fecal retention.   On limited flexion/extension views, no ligamentous instability identified.    IMPRESSION: Grade 1 anterolisthesis and mild disc space narrowing L4-5. Mild degenerative changes.   Electronically Signed by: Reyes People, MD on 04/02/2024 2:49 PM    Lumbar MRI dated 04/01/24:  FINDINGS:   Normal segmentation is assumed. There is a grade 1 anterolisthesis of L4 upon L5. The vertebral body heights are normal. There is no acute fracture or dislocation. No ligamentous signal abnormality. There is no marrow edema. The marrow signal demonstrates a benign pattern. The conus lies at L1-2. Cauda equina is normal. The  visualized retroperitoneum is unremarkable. The paraspinous soft tissues are normal.   T12-L1: Unremarkable.   L1-2: Subtle posterior disc bulge. No stenosis.   L2-3: No significant abnormality.   L3-4: No significant abnormality.   L4-5: Significant disc space narrowing. Diffuse uncovered disc bulge. Bilateral facet arthropathy. Bilateral  ligamentous hypertrophy. There is moderate central canal stenosis, moderate bilateral lateral recess stenosis and moderate bilateral neuroforaminal stenosis.   L5-S1: Disc bulge with a subtle central disc protrusion. Bilateral facet arthropathy. There is mild bilateral neuroforaminal stenosis.    IMPRESSION:  1. Moderate central canal stenosis, moderate bilateral lateral recess stenosis and moderate bilateral neuroforaminal stenosis L4-5.  2. Mild bilateral neuroforaminal stenosis L5-S1.   Electronically Signed by: Ozell Mervyn Raddle. on 04/04/2024 3:44 PM   I have personally reviewed the images and agree with the above interpretation.  Assessment and Plan: Beverly Hughes is a pleasant 58 y.o. female is s/p C5 corpectomy with ACDF C4-C7 by Dr. Clois on 10/15/19 for cervical myelopathy. Had some swallowing difficulty that he felt was related to esophageal manipulation. Had normal swallowing study on 01/22/20.    She continues with constant pain in her neck that is worse with sitting. She feels like she is choking on her food frequently. She has occasional numbness/tingling in her left arm with some weakness. She has some pain in between her shoulder blades. No dexterity issues or new balance issues.    Previous cervical xrays showed fusion C4-C7 with diffuse spondylosis and DDD.   She continues with intermittent LBP with intermittent posterior left leg pain to her knee. Pain is worse with sitting. She has numbness and tingling in both great toes. No weakness in her legs.    She has known slip L4-L5 with moderate central stenosis and moderate bilateral foraminal/lateral recess stenosis. Also with mild bilateral foraminal stenosis L5-S1.    Treatment options discussed with patient and following plan made:    - Follow up with E[facility]) as scheduled. She is scheduled for swallowing study on 04/25/24.  - Discussed PT for lumbar spine and/or injections. She declines. She is worried PT may  make her pain worse.  - Continue prn robaxin .  - She is out of work until 05/09/24. Will follow up with me on 05/07/24 to regroup.   Glade Boys PA-C Neurosurgery

## 2024-04-10 ENCOUNTER — Encounter: Payer: Self-pay | Admitting: Family

## 2024-04-14 ENCOUNTER — Telehealth (INDEPENDENT_AMBULATORY_CARE_PROVIDER_SITE_OTHER): Admitting: Orthopedic Surgery

## 2024-04-14 ENCOUNTER — Encounter: Payer: Self-pay | Admitting: Orthopedic Surgery

## 2024-04-14 DIAGNOSIS — Z981 Arthrodesis status: Secondary | ICD-10-CM

## 2024-04-14 DIAGNOSIS — R1310 Dysphagia, unspecified: Secondary | ICD-10-CM | POA: Diagnosis not present

## 2024-04-14 DIAGNOSIS — M5030 Other cervical disc degeneration, unspecified cervical region: Secondary | ICD-10-CM

## 2024-04-14 DIAGNOSIS — M48061 Spinal stenosis, lumbar region without neurogenic claudication: Secondary | ICD-10-CM

## 2024-04-14 DIAGNOSIS — M4316 Spondylolisthesis, lumbar region: Secondary | ICD-10-CM

## 2024-04-14 DIAGNOSIS — M47816 Spondylosis without myelopathy or radiculopathy, lumbar region: Secondary | ICD-10-CM

## 2024-04-14 DIAGNOSIS — M5416 Radiculopathy, lumbar region: Secondary | ICD-10-CM

## 2024-04-14 NOTE — Telephone Encounter (Signed)
 We should have her out until 10/24. She will see me on 10/22 to discuss further.

## 2024-04-14 NOTE — Telephone Encounter (Signed)
 Patient called the office, [place] needs her visit notes from today with Glade Boys including the MRI and xray report. This information has been faxed to [facility]

## 2024-04-25 ENCOUNTER — Encounter (HOSPITAL_COMMUNITY)

## 2024-04-25 ENCOUNTER — Other Ambulatory Visit (HOSPITAL_COMMUNITY)

## 2024-05-05 NOTE — Progress Notes (Unsigned)
 Referring Physician:  Lorren Greig PARAS, NP [address] [place],  [place] [address]  Primary Physician:  Lorren Greig PARAS, NP  History of Present Illness: Beverly Hughes has a history of HTN, hyperlipidemia.   She is s/p C5 corpectomy with ACDF C4-C7 by Dr. Clois on 10/15/19 for cervical myelopathy.   She continues with constant pain in her neck that is worse with sitting. She feels like she is choking on her food frequently. She has occasional numbness/tingling in her left arm with some weakness. She has some pain in between her shoulder blades. No dexterity issues or new balance issues.    Previous cervical xrays showed fusion C4-C7 with diffuse spondylosis and DDD.   She also had intermittent LBP with left leg pain. She has known slip L4-L5 with moderate central stenosis and moderate bilateral foraminal/lateral recess stenosis. Also with mild bilateral foraminal stenosis L5-S1.    She was to follow up with ENT. We discussed PT and/or injections for her lumbar spine and she declined. She remains out of work.   She is here for follow up.   She is about the same.   She continues with constant pain in her neck that is worse with sitting. She feels like she is choking on her food frequently- this is intermittent. She notes intermittent pain in the arms when neck pain is severe- maybe twice a week. She has occasional numbness/tingling in her left arm with some weakness. Feels like she will drop things when cooking.   She has intermittent pain in her lower back that feels like pressure- she does get this daily. No leg pain. She has numbness and tingling in both great toes. She has occasional weakness in her legs.   No dexterity issues or new balance issues.   Had normal swallowing study on 01/22/20.   She does not smoke.   Bowel/Bladder Dysfunction: none  Conservative measures:  Physical therapy: has not participated in within the past year Multimodal medical  therapy including regular antiinflammatories: flexeril , motrin , robaxin , ultram   Injections: has not received any epidural steroid injections  Past Surgery:  C5 corpectomy with ACDF C4-C7 by Dr. Clois on 10/15/19 for cervical myelopathy  Laurinda D Sitts has no symptoms of cervical myelopathy.  The symptoms are causing a significant impact on the patient's life.   Review of Systems:  A 10 point review of systems is negative, except for the pertinent positives and negatives detailed in the HPI.  Past Medical History: Past Medical History:  Diagnosis Date   Allergy    Arthritis    Phreesia 08/20/2020   Hypertension    Phreesia 08/20/2020   Mixed hyperlipidemia    Nut allergy    estonia nut    Past Surgical History: Past Surgical History:  Procedure Laterality Date   ANTERIOR CERVICAL CORPECTOMY N/A 10/15/2019   Procedure: ANTERIOR CERVICAL CORPECTOMY C5;  Surgeon: Clois Fret, MD;  Location: [facility] ORS;  Service: Neurosurgery;  Laterality: N/A;   ANTERIOR CERVICAL DECOMP/DISCECTOMY FUSION N/A 10/15/2019   Procedure: C6-7 ANTERIOR CERVICAL DISCECTOMY AND FUSION, C4-7 ANTERIOR SPINAL INSTRUMENTATION;  Surgeon: Clois Fret, MD;  Location: [facility] ORS;  Service: Neurosurgery;  Laterality: N/A;   COLONOSCOPY WITH PROPOFOL  N/A 10/04/2020   Procedure: COLONOSCOPY WITH PROPOFOL ;  Surgeon: Unk Corinn Skiff, MD;  Location: [facility];  Service: Gastroenterology;  Laterality: N/A;   EYE SURGERY     tear duct repair   FRACTURE SURGERY     left foot fracture at 58  yrs old   TUBAL LIGATION      Allergies: Allergies as of 05/07/2024 - Review Complete 05/07/2024  Allergen Reaction Noted   [place]ia nut (berthollefia excelsa) Anaphylaxis 01/14/2024   Other Anaphylaxis 12/31/2018   Codeine sulfate  07/26/2013    Medications: Outpatient Encounter Medications as of 05/07/2024  Medication Sig   famotidine  (PEPCID ) 20 MG tablet Take 1 tablet (20 mg total) by mouth 2 (two)  times daily.   ibuprofen  (ADVIL ) 600 MG tablet Take 600 mg by mouth every 6 (six) hours as needed.   methocarbamol  (ROBAXIN ) 500 MG tablet Take 1 tablet (500 mg total) by mouth every 8 (eight) hours as needed for muscle spasms.   No facility-administered encounter medications on file as of 05/07/2024.    Social History: Social History   Tobacco Use   Smoking status: Never   Smokeless tobacco: Never  Vaping Use   Vaping status: Never Used  Substance Use Topics   Alcohol use: Yes    Alcohol/week: 2.0 standard drinks of alcohol    Types: 1 Cans of beer, 1 Glasses of wine per week    Comment: socially   Drug use: No    Family Medical History: Family History  Problem Relation Age of Onset   Diabetes Mother    Kidney Stones Mother    Heart disease Mother    Cancer Father    Prostate cancer Father    Diabetes Maternal Grandmother    Heart disease Maternal Grandmother    Hypertension Maternal Grandmother    Diabetes Maternal Grandfather    Heart disease Maternal Grandfather    Hypertension Maternal Grandfather    Colon cancer Paternal Grandmother    Hypertension Paternal Grandmother     Physical Examination: Vitals:   05/07/24 1341 05/07/24 1418  BP: (!) 140/90 130/88     Awake, alert, oriented to person, place, and time.  Speech is clear and fluent. Fund of knowledge is appropriate.   Cranial Nerves: Pupils equal round and reactive to light.  Facial tone is symmetric.    Well healed cervical incision.   No abnormal lesions on exposed skin.   Strength: Side Biceps Triceps Deltoid Interossei Grip Wrist Ext. Wrist Flex.  R 5 5 5 5 5 5 5   L 5 5 5 5 5 5 5    Side Iliopsoas Quads Hamstring PF DF EHL  R 5 5 5 5 5 5   L 5 5 5 5 5 5    Reflexes are 2+ and symmetric at the biceps, triceps, brachioradialis, patella and achilles.   Hoffman's is absent bilaterally.  Clonus is not present.   Bilateral upper and lower extremity sensation is intact to light touch.     No  pain with IR/ER of both hips.   Gait is normal.     Medical Decision Making  Imaging: none   Assessment and Plan: Ms. Gaumond is a pleasant 58 y.o. female is s/p C5 corpectomy with ACDF C4-C7 by Dr. Clois on 10/15/19 for cervical myelopathy. Had some swallowing difficulty that he felt was related to esophageal manipulation. Had normal swallowing study on 01/22/20.   She continues with constant pain in her neck that is worse with sitting. She feels like she is choking on her food frequently- this is intermittent. She notes intermittent pain in the arms when neck pain is severe- maybe twice a week. She has occasional numbness/tingling in her left arm with some weakness. Feels like she will drop things when cooking.   Previous cervical  xrays showed fusion C4-C7 with diffuse spondylosis and DDD.   She has intermittent pain in her lower back that feels like pressure- she does get this daily. No leg pain. She has numbness and tingling in both great toes.   She has known slip L4-L5 with moderate central stenosis and moderate bilateral foraminal/lateral recess stenosis. Also with mild bilateral foraminal stenosis L5-S1.    Treatment options discussed with patient and following plan made:   - MRI of cervical spine to further evaluate intermittent left arm pain and dropping things. Will do WIDE BORE MRI.  - Discussed CT of cervical spine and possible EMG of upper extremities. Will see what cervical MRI shows first. - Follow up with ENT as scheduled.  - We again discussed PT for lumbar spine and/or injections. She declines both and is interested in  medical management of pain. She is not interested in any lumbar surgery.  - Referral to [facility] in [facility] for medical management of pain.  - She has been out on short term disability. Will continue this until 07/04/24 (or when it expires). She is aware we do not do long term disability. If she unable to go back to work, can order  [employer].  - Will plan to schedule MyChart visit to review her cervical MRI once I have the results.   I spent a total of 30 minutes in face-to-face and non-face-to-face activities related to this patient's care today including review of outside records, review of imaging, review of symptoms, physical exam, discussion of differential diagnosis, discussion of treatment options, and documentation.   Glade Boys PA-C Dept. of Neurosurgery

## 2024-05-06 ENCOUNTER — Telehealth (INDEPENDENT_AMBULATORY_CARE_PROVIDER_SITE_OTHER): Payer: Self-pay

## 2024-05-06 NOTE — Telephone Encounter (Signed)
 Called and lvm for to call back to the office to reschedule appointment regarding her appt for 06/19/2024 with Dr Soldatova . Left contact information to call back.

## 2024-05-07 ENCOUNTER — Encounter: Payer: Self-pay | Admitting: Orthopedic Surgery

## 2024-05-07 ENCOUNTER — Ambulatory Visit: Admitting: Orthopedic Surgery

## 2024-05-07 VITALS — BP 130/88 | Ht 64.0 in | Wt 200.0 lb

## 2024-05-07 DIAGNOSIS — R1310 Dysphagia, unspecified: Secondary | ICD-10-CM | POA: Diagnosis not present

## 2024-05-07 DIAGNOSIS — M48061 Spinal stenosis, lumbar region without neurogenic claudication: Secondary | ICD-10-CM

## 2024-05-07 DIAGNOSIS — M47816 Spondylosis without myelopathy or radiculopathy, lumbar region: Secondary | ICD-10-CM

## 2024-05-07 DIAGNOSIS — M542 Cervicalgia: Secondary | ICD-10-CM

## 2024-05-07 DIAGNOSIS — M5412 Radiculopathy, cervical region: Secondary | ICD-10-CM | POA: Diagnosis not present

## 2024-05-07 DIAGNOSIS — M545 Low back pain, unspecified: Secondary | ICD-10-CM

## 2024-05-07 DIAGNOSIS — M4316 Spondylolisthesis, lumbar region: Secondary | ICD-10-CM

## 2024-05-07 DIAGNOSIS — Z981 Arthrodesis status: Secondary | ICD-10-CM

## 2024-05-07 NOTE — Patient Instructions (Signed)
 It was so nice to see you today. Thank you so much for coming in.    I want to get an MRI of your neck to look into things further. We will get this approved through your insurance and [facility] will call you to schedule the appointment. Ask about your patient responsibility. You do not need to pay this prior to getting MRI, they can bill you.   Make sure you are scheduled for the WIDE BORE MRI.   After you have the MRI, it can take 14-28 days for me to get the results back. If I don't have them in 2 weeks, we will call to try to get the results.   Once I have the results, we will call you to schedule a follow up MyChart visit with me to review them.  I sent a referral to [facility] in [facility] for medical management of your pain. They should call you or you can call them at [phone].   Follow up with ENT for further testing as recommended.   Will plan to keep you out of work until your short term disability expires. After that we can get an FCE if needed. You can also discuss permanent disability with your PCP and/or pain management.    Please do not hesitate to call if you have any questions or concerns. You can also message me in MyChart.   Glade Boys PA-C [phone]     The physicians and staff at [facility] at [facility] are committed to providing excellent care. You may receive a survey asking for feedback about your experience at our office. We value you your feedback and appreciate you taking the time to to fill it out. The [facility] leadership team is also available to discuss your experience in person, feel free to contact us  [phone].

## 2024-05-21 ENCOUNTER — Ambulatory Visit (HOSPITAL_COMMUNITY): Admission: RE | Admit: 2024-05-21 | Source: Ambulatory Visit

## 2024-05-21 ENCOUNTER — Ambulatory Visit (HOSPITAL_COMMUNITY)
Admission: RE | Admit: 2024-05-21 | Discharge: 2024-05-21 | Disposition: A | Discharge status: Home / Self Care | Source: Ambulatory Visit | Attending: Otolaryngology | Admitting: Otolaryngology

## 2024-05-21 ENCOUNTER — Ambulatory Visit (HOSPITAL_COMMUNITY)

## 2024-05-21 DIAGNOSIS — R1310 Dysphagia, unspecified: Secondary | ICD-10-CM

## 2024-05-26 ENCOUNTER — Telehealth (INDEPENDENT_AMBULATORY_CARE_PROVIDER_SITE_OTHER): Payer: Self-pay

## 2024-05-26 NOTE — Telephone Encounter (Signed)
 Patient is a [employer] [employer] patient and was scheduled for a barium swallow study on 06/05/24, but it appears maybe the Epic system canceled the appointment as discharge provider automatic.  Amber with [facility] wanted to see if patient is still needing to get this study done.  Patient is scheduled with you for a follow up on 06/20/24.  Please advise.

## 2024-05-27 ENCOUNTER — Encounter: Admitting: Family

## 2024-05-27 NOTE — Progress Notes (Signed)
 Erroneous encounter-disregard

## 2024-05-27 NOTE — Telephone Encounter (Signed)
 Amber is awaiting a reply regarding the patient.  She said this is time sensitive and they will need a new order submitted if you need patient to have the op mbs completed before seeing you on 12/5.  Please see her message below from the Epic chat she sent to me today:  Okay thank you. It appears the 12/5 appt with Penne Croak is a f/u for the op mbs and eso, which would typically mean those appts would need to take place prior to the f/u with him. If the appts are still needed, we will need him to submit a new order [id number] for the op mbs please   this is time sensitive as her appts are scheduled for next week. please let us  know the outcome asap  AH

## 2024-05-28 ENCOUNTER — Other Ambulatory Visit (INDEPENDENT_AMBULATORY_CARE_PROVIDER_SITE_OTHER): Payer: Self-pay

## 2024-05-28 ENCOUNTER — Telehealth (INDEPENDENT_AMBULATORY_CARE_PROVIDER_SITE_OTHER): Payer: Self-pay

## 2024-05-28 DIAGNOSIS — R1310 Dysphagia, unspecified: Secondary | ICD-10-CM

## 2024-05-28 NOTE — Telephone Encounter (Signed)
 Called to reschedule appointment with Dr.Knight on 12/5, vm box was full.

## 2024-05-28 NOTE — Telephone Encounter (Signed)
 Ordered

## 2024-05-29 ENCOUNTER — Other Ambulatory Visit

## 2024-06-05 ENCOUNTER — Ambulatory Visit (HOSPITAL_COMMUNITY)
Admission: RE | Admit: 2024-06-05 | Discharge: 2024-06-05 | Disposition: A | Source: Ambulatory Visit | Attending: Otolaryngology

## 2024-06-05 ENCOUNTER — Ambulatory Visit (HOSPITAL_COMMUNITY)
Admission: RE | Admit: 2024-06-05 | Discharge: 2024-06-05 | Disposition: A | Discharge status: Home / Self Care | Source: Ambulatory Visit | Attending: Family | Admitting: Family

## 2024-06-05 DIAGNOSIS — I10 Essential (primary) hypertension: Secondary | ICD-10-CM | POA: Insufficient documentation

## 2024-06-05 DIAGNOSIS — R09A2 Foreign body sensation, throat: Secondary | ICD-10-CM | POA: Diagnosis not present

## 2024-06-05 DIAGNOSIS — Z981 Arthrodesis status: Secondary | ICD-10-CM | POA: Insufficient documentation

## 2024-06-05 DIAGNOSIS — K224 Dyskinesia of esophagus: Secondary | ICD-10-CM | POA: Diagnosis not present

## 2024-06-05 DIAGNOSIS — R1310 Dysphagia, unspecified: Secondary | ICD-10-CM | POA: Insufficient documentation

## 2024-06-05 DIAGNOSIS — M1990 Unspecified osteoarthritis, unspecified site: Secondary | ICD-10-CM | POA: Diagnosis not present

## 2024-06-05 DIAGNOSIS — R0989 Other specified symptoms and signs involving the circulatory and respiratory systems: Secondary | ICD-10-CM | POA: Insufficient documentation

## 2024-06-05 DIAGNOSIS — E782 Mixed hyperlipidemia: Secondary | ICD-10-CM | POA: Diagnosis not present

## 2024-06-05 NOTE — Progress Notes (Signed)
 Modified Barium Swallow Study  Patient Details  Name: Beverly Hughes MRN: [id number] Date of Birth: January 16, 1966  Today's Date: 06/05/2024  Modified Barium Swallow completed.  Full report located under Chart Review in the Imaging Section.  History of Present Illness Patient is a 58 y.o. female with hx of ACDF 2021 C4-7 who presents with trouble swallowing after neck surgery. S/p C5 corpectomy with ACDF C4-C7 on 10/15/19 for cervical myelopathy. PMH: HTN, hyperlipidemia. MBS on 01/22/20 revealed no signs of pharyngeal dysphagia. Patient is now experiencing difficulty swallowing both solids and liquids, which began after her neck surgery. The sensation is described as food or drink feeling 'caught' in her throat, requiring her to hold her head up while eating and drinking. She cannot swallow with her head down and must keep her face straight. This issue has persisted since her surgery. She also experiences pressure in her spine when sitting for long periods, which she describes as feeling like it 'chokes' her and makes it difficult to breathe. This pressure seems to radiate to her neck, exacerbating her swallowing difficulties. No history of heartburn or indigestion is reported, and she has not been on medication for these issues. MBS ordered to rule out aspiration.   Clinical Impression (P) Patient is currently presenting with a functional swallow per this MBS. No aspiration or penetration was observed across any of the tested consistencies in this study. SLP recommending patient continue regular thin diet. No SLP follow up needed. Factors that may increase risk of adverse event in presence of aspiration Noe & Lianne 2021):    DIGEST Swallow Severity Rating*  Safety: 0  Efficiency: 0  Overall Pharyngeal Swallow Severity: 0 1: mild; 2: moderate; 3: severe; 4: profound  *The Dynamic Imaging Grade of Swallowing Toxicity is standardized for the head and neck cancer population, however,  demonstrates promising clinical applications across populations to standardize the clinical rating of pharyngeal swallow safety and severity.   Swallow Evaluation Recommendations Recommendations: (P) PO diet PO Diet Recommendation: (P) Regular;Thin liquids (Level 0) Liquid Administration via: (P) Cup;Straw Medication Administration: (P) Other (Comment) (As tolerated) Supervision: (P) Patient able to self-feed Swallowing strategies  : (P) Slow rate;Small bites/sips Postural changes: (P) Position pt fully upright for meals;Stay upright 30-60 min after meals Oral care recommendations: (P) Oral care BID (2x/day)   Damien Hy  Graduate SLP Clinican

## 2024-06-06 ENCOUNTER — Telehealth: Payer: Self-pay | Admitting: Family

## 2024-06-06 ENCOUNTER — Telehealth (INDEPENDENT_AMBULATORY_CARE_PROVIDER_SITE_OTHER): Payer: Self-pay

## 2024-06-06 ENCOUNTER — Encounter (INDEPENDENT_AMBULATORY_CARE_PROVIDER_SITE_OTHER): Payer: Self-pay

## 2024-06-06 NOTE — Telephone Encounter (Signed)
 Pt was in the office to check in for her appt. Upon check-in, I realized I was on the wrong pt's chart who had the same last name as pt. Once I let pt know that her appt was not today but rather on 11/25, pt insisted her appt was today. Pt was visibly upset due to the confusion. I let pt know that her original appt is on 11/25. As I was calling pts who no-showed for today, I mistakenly called pt thinking it was the other pt with the same last name, not realizing I was calling this pt because during that initial check-in encounter, I had updated the pt's address and phone number as I was verifying information during the check-in process. Pt was irate and wanted to speak to the practice leader. I apologized to pt and gave/gained clarity to the confusion. Pt understood the confusion but still wanted to talk to [facility], it consultant.   SN: other pt's original address and phone number were added back to the chart.

## 2024-06-07 ENCOUNTER — Other Ambulatory Visit

## 2024-06-10 ENCOUNTER — Encounter: Admitting: Family

## 2024-06-10 NOTE — Telephone Encounter (Signed)
 Good morning! Please call our office at [phone] we need to reschedule your upcoming appointment on 06/20/2024. Dr Anice will be in surgery at that time. Thank you!

## 2024-06-10 NOTE — Progress Notes (Signed)
 Erroneous encounter-disregard

## 2024-06-13 ENCOUNTER — Other Ambulatory Visit

## 2024-06-17 NOTE — Telephone Encounter (Signed)
 Ritta- do you know of any other pain management clinics in [place]?

## 2024-06-18 NOTE — Telephone Encounter (Signed)
 Faxed

## 2024-06-19 ENCOUNTER — Ambulatory Visit (INDEPENDENT_AMBULATORY_CARE_PROVIDER_SITE_OTHER): Admitting: Otolaryngology

## 2024-06-20 ENCOUNTER — Ambulatory Visit (INDEPENDENT_AMBULATORY_CARE_PROVIDER_SITE_OTHER)

## 2024-06-25 ENCOUNTER — Other Ambulatory Visit

## 2024-07-03 NOTE — Telephone Encounter (Signed)
 Message to patient- if short term disability has not expired and will cover her to be out through the 12/23 and return on 12/24 then that is okay.   I am not doing anything with long term disability. She and I have discussed this.

## 2024-07-05 ENCOUNTER — Other Ambulatory Visit

## 2024-07-07 NOTE — Telephone Encounter (Signed)
 Okay to extend short term disability to be out through 12/23 and return on 12/24. I will not do any long term disability.

## 2024-07-07 NOTE — Telephone Encounter (Addendum)
 Patient called the office and spoke with Caitlin,. Patient also emailed me as well.   Paperwork has not been received by [employer]. Patient is wanting to extend disability until the 24th

## 2024-07-08 NOTE — Telephone Encounter (Signed)
 Done

## 2024-07-14 ENCOUNTER — Ambulatory Visit: Admitting: Cardiology

## 2024-07-22 ENCOUNTER — Ambulatory Visit (INDEPENDENT_AMBULATORY_CARE_PROVIDER_SITE_OTHER)

## 2024-07-25 ENCOUNTER — Other Ambulatory Visit

## 2024-07-29 NOTE — Progress Notes (Unsigned)
 "    HPI: [name] SVT.  Seen 8/24 with SVT.  Patient converted to sinus rhythm with 6 mg of adenosine.  HR 170.  Echocardiogram September 2024 showed normal LV function.  Since last seen   Current Outpatient Medications  Medication Sig Dispense Refill   famotidine  (PEPCID ) 20 MG tablet Take 1 tablet (20 mg total) by mouth 2 (two) times daily. 30 tablet 3   ibuprofen  (ADVIL ) 600 MG tablet Take 600 mg by mouth every 6 (six) hours as needed.     methocarbamol  (ROBAXIN ) 500 MG tablet Take 1 tablet (500 mg total) by mouth every 8 (eight) hours as needed for muscle spasms. 90 tablet 0   No current facility-administered medications for this visit.     Past Medical History:  Diagnosis Date   Allergy    Arthritis    Phreesia 08/20/2020   Hypertension    Phreesia 08/20/2020   Mixed hyperlipidemia    Nut allergy    brazil nut    Past Surgical History:  Procedure Laterality Date   ANTERIOR CERVICAL CORPECTOMY N/A 10/15/2019   Procedure: ANTERIOR CERVICAL CORPECTOMY C5;  Surgeon: Clois Fret, MD;  Location: [facility] ORS;  Service: Neurosurgery;  Laterality: N/A;   ANTERIOR CERVICAL DECOMP/DISCECTOMY FUSION N/A 10/15/2019   Procedure: C6-7 ANTERIOR CERVICAL DISCECTOMY AND FUSION, C4-7 ANTERIOR SPINAL INSTRUMENTATION;  Surgeon: Clois Fret, MD;  Location: [facility];  Service: Neurosurgery;  Laterality: N/A;   COLONOSCOPY WITH PROPOFOL  N/A 10/04/2020   Procedure: COLONOSCOPY WITH PROPOFOL ;  Surgeon: Unk Corinn Skiff, MD;  Location: [facility] ENDOSCOPY;  Service: Gastroenterology;  Laterality: N/A;   EYE SURGERY     tear duct repair   FRACTURE SURGERY     left foot fracture at 59 yrs old   TUBAL LIGATION      Social History   Socioeconomic History   Marital status: Legally Separated    Spouse name: Not on file   Number of children: 3   Years of education: Not on file   Highest education level: Not on file  Occupational History   Not on file  Tobacco Use   Smoking status: Never    Smokeless tobacco: Never  Vaping Use   Vaping status: Never Used  Substance and Sexual Activity   Alcohol use: Yes    Alcohol/week: 2.0 standard drinks of alcohol    Types: 1 Cans of beer, 1 Glasses of wine per week    Comment: socially   Drug use: No   Sexual activity: Not on file  Other Topics Concern   Not on file  Social History Narrative   Not on file   Social Drivers of Health   Tobacco Use: Low Risk (05/07/2024)   Patient History    Smoking Tobacco Use: Never    Smokeless Tobacco Use: Never    Passive Exposure: Not on file  Financial Resource Strain: High Risk (01/14/2024)   Received from [facility]   Overall Financial Resource Strain (CARDIA)    Difficulty of Paying Living Expenses: Hard  Food Insecurity: No Food Insecurity (01/14/2024)   Received from [facility]   Epic    Within the past 12 months, you worried that your food would run out before you got the money to buy more.: Never true    Within the past 12 months, the food you bought just didn't last and you didn't have money to get more.: Never true  Transportation Needs: No Transportation Needs (01/14/2024)   Received from  [facility]   PRAPARE - Transportation    In the past 12 months, has lack of transportation kept you from medical appointments or from getting medications?: No    Lack of Transportation (Non-Medical): No  Physical Activity: Not on file  Stress: Not on file  Social Connections: Unknown (11/24/2021)   Received from [facility]   Social Network    Social Network: Not on file  Intimate Partner Violence: Unknown (10/21/2021)   Received from [facility]   HITS    Physically Hurt: Not on file    Insult or Talk Down To: Not on file    Threaten Physical Harm: Not on file    Scream or Curse: Not on file  Depression (PHQ2-9): Low Risk (01/03/2022)   Depression (PHQ2-9)    PHQ-2 Score: 0  Alcohol Screen: Not on file  Housing: Low Risk   (01/14/2024)   Received from [place]   Epic    In the last 12 months, was there a time when you were not able to pay the mortgage or rent on time?: No    In the past 12 months, how many times have you moved where you were living?: 0    At any time in the past 12 months, were you homeless or living in a shelter (including now)?: No  Utilities: Not At Risk (01/14/2024)   Received from [facility]   Epic    In the past 12 months has the electric, gas, oil, or water company threatened to shut off services in your home?: No  Health Literacy: Not on file    Family History  Problem Relation Age of Onset   Diabetes Mother    Kidney Stones Mother    Heart disease Mother    Cancer Father    Prostate cancer Father    Diabetes Maternal Grandmother    Heart disease Maternal Grandmother    Hypertension Maternal Grandmother    Diabetes Maternal Grandfather    Heart disease Maternal Grandfather    Hypertension Maternal Grandfather    Colon cancer Paternal Grandmother    Hypertension Paternal Grandmother     ROS: no fevers or chills, productive cough, hemoptysis, dysphasia, odynophagia, melena, hematochezia, dysuria, hematuria, rash, seizure activity, orthopnea, PND, pedal edema, claudication. Remaining systems are negative.  Physical Exam: Well-developed well-nourished in no acute distress.  Skin is warm and dry.  HEENT is normal.  Neck is supple.  Chest is clear to auscultation with normal expansion.  Cardiovascular exam is regular rate and rhythm.  Abdominal exam nontender or distended. No masses palpated. Extremities show no edema. neuro grossly intact  ECG- personally reviewed  A/P  1 SVT-no recurrences since previous office visit.  Continue beta-blocker at present dose.  Patient can be referred for ablation in the future if she has more frequent episodes.  2 hypertension-blood pressure controlled.  Continue present medical regimen.  3  hyperlipidemia-managed by primary care.  Redell Shallow, MD    "

## 2024-08-12 ENCOUNTER — Ambulatory Visit: Admitting: Cardiology

## 2024-11-21 ENCOUNTER — Encounter: Admitting: Family
